# Patient Record
Sex: Female | Born: 1985 | Race: White | Hispanic: No | Marital: Single | Smoking: Former smoker
Health system: Southern US, Community
[De-identification: ages and names within clinical notes are randomized; demographics above are authoritative.]

## PROBLEM LIST (undated history)

## (undated) DIAGNOSIS — F319 Bipolar disorder, unspecified: Secondary | ICD-10-CM

## (undated) DIAGNOSIS — I10 Essential (primary) hypertension: Secondary | ICD-10-CM

## (undated) DIAGNOSIS — F32A Depression, unspecified: Secondary | ICD-10-CM

## (undated) DIAGNOSIS — F329 Major depressive disorder, single episode, unspecified: Secondary | ICD-10-CM

## (undated) DIAGNOSIS — K589 Irritable bowel syndrome without diarrhea: Secondary | ICD-10-CM

## (undated) DIAGNOSIS — G43909 Migraine, unspecified, not intractable, without status migrainosus: Secondary | ICD-10-CM

---
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
## 2005-01-08 ENCOUNTER — Ambulatory Visit: Payer: Self-pay | Admitting: Family Medicine

## 2005-01-11 ENCOUNTER — Ambulatory Visit: Payer: Self-pay | Admitting: Family Medicine

## 2005-04-04 ENCOUNTER — Emergency Department (HOSPITAL_COMMUNITY): Admission: AD | Admit: 2005-04-04 | Discharge: 2005-04-04 | Payer: Self-pay | Admitting: Emergency Medicine

## 2005-10-04 ENCOUNTER — Emergency Department (HOSPITAL_COMMUNITY): Admission: EM | Admit: 2005-10-04 | Discharge: 2005-10-04 | Payer: Self-pay | Admitting: Emergency Medicine

## 2005-11-26 ENCOUNTER — Emergency Department (HOSPITAL_COMMUNITY): Admission: EM | Admit: 2005-11-26 | Discharge: 2005-11-26 | Payer: Self-pay | Admitting: Family Medicine

## 2006-03-31 ENCOUNTER — Emergency Department (HOSPITAL_COMMUNITY): Admission: EM | Admit: 2006-03-31 | Discharge: 2006-03-31 | Payer: Self-pay

## 2006-04-10 ENCOUNTER — Emergency Department (HOSPITAL_COMMUNITY): Admission: EM | Admit: 2006-04-10 | Discharge: 2006-04-10 | Payer: Self-pay | Admitting: Emergency Medicine

## 2006-07-21 ENCOUNTER — Emergency Department (HOSPITAL_COMMUNITY): Admission: EM | Admit: 2006-07-21 | Discharge: 2006-07-21 | Payer: Self-pay | Admitting: Family Medicine

## 2006-08-22 ENCOUNTER — Emergency Department (HOSPITAL_COMMUNITY): Admission: EM | Admit: 2006-08-22 | Discharge: 2006-08-22 | Payer: Self-pay | Admitting: Family Medicine

## 2006-09-09 ENCOUNTER — Emergency Department (HOSPITAL_COMMUNITY): Admission: EM | Admit: 2006-09-09 | Discharge: 2006-09-10 | Payer: Self-pay | Admitting: Emergency Medicine

## 2006-09-12 ENCOUNTER — Emergency Department (HOSPITAL_COMMUNITY): Admission: EM | Admit: 2006-09-12 | Discharge: 2006-09-12 | Payer: Self-pay | Admitting: Emergency Medicine

## 2006-09-18 ENCOUNTER — Emergency Department (HOSPITAL_COMMUNITY): Admission: EM | Admit: 2006-09-18 | Discharge: 2006-09-18 | Payer: Self-pay | Admitting: Family Medicine

## 2006-09-20 ENCOUNTER — Emergency Department (HOSPITAL_COMMUNITY): Admission: EM | Admit: 2006-09-20 | Discharge: 2006-09-20 | Payer: Self-pay | Admitting: Emergency Medicine

## 2006-11-06 ENCOUNTER — Emergency Department (HOSPITAL_COMMUNITY): Admission: EM | Admit: 2006-11-06 | Discharge: 2006-11-06 | Payer: Self-pay | Admitting: Emergency Medicine

## 2006-11-12 ENCOUNTER — Emergency Department (HOSPITAL_COMMUNITY): Admission: EM | Admit: 2006-11-12 | Discharge: 2006-11-12 | Payer: Self-pay | Admitting: Emergency Medicine

## 2006-11-13 ENCOUNTER — Emergency Department (HOSPITAL_COMMUNITY): Admission: EM | Admit: 2006-11-13 | Discharge: 2006-11-13 | Payer: Self-pay | Admitting: Emergency Medicine

## 2006-11-14 ENCOUNTER — Ambulatory Visit: Payer: Self-pay | Admitting: Hospitalist

## 2006-11-14 ENCOUNTER — Inpatient Hospital Stay (HOSPITAL_COMMUNITY): Admission: EM | Admit: 2006-11-14 | Discharge: 2006-11-15 | Payer: Self-pay | Admitting: Emergency Medicine

## 2006-12-22 ENCOUNTER — Emergency Department (HOSPITAL_COMMUNITY): Admission: EM | Admit: 2006-12-22 | Discharge: 2006-12-23 | Payer: Self-pay | Admitting: Emergency Medicine

## 2007-04-02 ENCOUNTER — Emergency Department (HOSPITAL_COMMUNITY): Admission: EM | Admit: 2007-04-02 | Discharge: 2007-04-03 | Payer: Self-pay | Admitting: Emergency Medicine

## 2007-04-06 ENCOUNTER — Emergency Department (HOSPITAL_COMMUNITY): Admission: EM | Admit: 2007-04-06 | Discharge: 2007-04-06 | Payer: Self-pay | Admitting: Emergency Medicine

## 2007-04-16 ENCOUNTER — Emergency Department (HOSPITAL_COMMUNITY): Admission: EM | Admit: 2007-04-16 | Discharge: 2007-04-16 | Payer: Self-pay | Admitting: Emergency Medicine

## 2007-06-05 ENCOUNTER — Emergency Department (HOSPITAL_COMMUNITY): Admission: EM | Admit: 2007-06-05 | Discharge: 2007-06-05 | Payer: Self-pay | Admitting: Family Medicine

## 2007-06-18 ENCOUNTER — Emergency Department (HOSPITAL_COMMUNITY): Admission: EM | Admit: 2007-06-18 | Discharge: 2007-06-18 | Payer: Self-pay | Admitting: Family Medicine

## 2007-06-19 ENCOUNTER — Inpatient Hospital Stay (HOSPITAL_COMMUNITY): Admission: AD | Admit: 2007-06-19 | Discharge: 2007-06-19 | Payer: Self-pay | Admitting: Obstetrics and Gynecology

## 2007-06-20 ENCOUNTER — Inpatient Hospital Stay (HOSPITAL_COMMUNITY): Admission: AD | Admit: 2007-06-20 | Discharge: 2007-06-20 | Payer: Self-pay | Admitting: Obstetrics and Gynecology

## 2007-08-20 ENCOUNTER — Inpatient Hospital Stay (HOSPITAL_COMMUNITY): Admission: AD | Admit: 2007-08-20 | Discharge: 2007-08-20 | Payer: Self-pay | Admitting: Family Medicine

## 2007-08-23 ENCOUNTER — Emergency Department (HOSPITAL_COMMUNITY): Admission: EM | Admit: 2007-08-23 | Discharge: 2007-08-23 | Payer: Self-pay | Admitting: Emergency Medicine

## 2007-10-05 ENCOUNTER — Emergency Department (HOSPITAL_COMMUNITY): Admission: EM | Admit: 2007-10-05 | Discharge: 2007-10-05 | Payer: Self-pay | Admitting: Emergency Medicine

## 2008-01-15 ENCOUNTER — Emergency Department (HOSPITAL_COMMUNITY): Admission: EM | Admit: 2008-01-15 | Discharge: 2008-01-15 | Payer: Self-pay | Admitting: Family Medicine

## 2008-02-20 ENCOUNTER — Emergency Department (HOSPITAL_COMMUNITY): Admission: EM | Admit: 2008-02-20 | Discharge: 2008-02-20 | Payer: Self-pay | Admitting: Family Medicine

## 2008-05-07 ENCOUNTER — Emergency Department (HOSPITAL_COMMUNITY): Admission: EM | Admit: 2008-05-07 | Discharge: 2008-05-07 | Payer: Self-pay | Admitting: Family Medicine

## 2008-07-08 ENCOUNTER — Emergency Department (HOSPITAL_COMMUNITY): Admission: EM | Admit: 2008-07-08 | Discharge: 2008-07-08 | Payer: Self-pay | Admitting: Emergency Medicine

## 2008-08-18 ENCOUNTER — Ambulatory Visit: Payer: Self-pay | Admitting: Diagnostic Radiology

## 2008-08-18 ENCOUNTER — Emergency Department (HOSPITAL_BASED_OUTPATIENT_CLINIC_OR_DEPARTMENT_OTHER): Admission: EM | Admit: 2008-08-18 | Discharge: 2008-08-18 | Payer: Self-pay | Admitting: Emergency Medicine

## 2008-08-29 ENCOUNTER — Emergency Department (HOSPITAL_COMMUNITY): Admission: EM | Admit: 2008-08-29 | Discharge: 2008-08-29 | Payer: Self-pay | Admitting: Emergency Medicine

## 2009-04-30 ENCOUNTER — Emergency Department (HOSPITAL_COMMUNITY): Admission: EM | Admit: 2009-04-30 | Discharge: 2009-04-30 | Payer: Self-pay | Admitting: Emergency Medicine

## 2009-09-08 ENCOUNTER — Emergency Department (HOSPITAL_COMMUNITY): Admission: EM | Admit: 2009-09-08 | Discharge: 2009-09-08 | Payer: Self-pay | Admitting: Emergency Medicine

## 2009-12-27 ENCOUNTER — Emergency Department (HOSPITAL_COMMUNITY): Admission: EM | Admit: 2009-12-27 | Discharge: 2009-12-28 | Payer: Self-pay | Admitting: Emergency Medicine

## 2010-04-22 ENCOUNTER — Emergency Department (HOSPITAL_COMMUNITY)
Admission: EM | Admit: 2010-04-22 | Discharge: 2010-04-22 | Payer: Self-pay | Source: Home / Self Care | Admitting: Emergency Medicine

## 2010-04-22 LAB — URINALYSIS, ROUTINE W REFLEX MICROSCOPIC
Ketones, ur: NEGATIVE mg/dL
Nitrite: NEGATIVE
Protein, ur: NEGATIVE mg/dL
Specific Gravity, Urine: 1.005 (ref 1.005–1.030)
Urine Glucose, Fasting: NEGATIVE mg/dL
Urobilinogen, UA: 0.2 mg/dL (ref 0.0–1.0)
pH: 6 (ref 5.0–8.0)

## 2010-04-22 LAB — URINE MICROSCOPIC-ADD ON

## 2010-04-22 LAB — WET PREP, GENITAL: Trich, Wet Prep: NONE SEEN

## 2010-04-22 LAB — POCT PREGNANCY, URINE: Preg Test, Ur: NEGATIVE

## 2010-04-23 LAB — GC/CHLAMYDIA PROBE AMP, GENITAL
Chlamydia, DNA Probe: NEGATIVE
GC Probe Amp, Genital: NEGATIVE

## 2010-06-15 LAB — URINALYSIS, ROUTINE W REFLEX MICROSCOPIC
Bilirubin Urine: NEGATIVE
Glucose, UA: NEGATIVE mg/dL
Hgb urine dipstick: NEGATIVE
Ketones, ur: NEGATIVE mg/dL
Nitrite: NEGATIVE
Protein, ur: NEGATIVE mg/dL
Specific Gravity, Urine: 1.015 (ref 1.005–1.030)
Urobilinogen, UA: 0.2 mg/dL (ref 0.0–1.0)
pH: 6 (ref 5.0–8.0)

## 2010-06-15 LAB — PREGNANCY, URINE: Preg Test, Ur: NEGATIVE

## 2010-06-15 LAB — URINE CULTURE: Colony Count: 35000

## 2010-06-15 LAB — URINE MICROSCOPIC-ADD ON

## 2010-06-17 LAB — POCT RAPID STREP A (OFFICE): Streptococcus, Group A Screen (Direct): NEGATIVE

## 2010-07-08 LAB — RAPID STREP SCREEN (MED CTR MEBANE ONLY): Streptococcus, Group A Screen (Direct): POSITIVE — AB
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
## 2010-10-18 ENCOUNTER — Observation Stay (HOSPITAL_COMMUNITY)
Admission: EM | Admit: 2010-10-18 | Discharge: 2010-10-19 | DRG: 603 | Disposition: A | Discharge status: Home Health | Payer: Medicaid Other | Attending: Internal Medicine | Admitting: Internal Medicine

## 2010-10-18 DIAGNOSIS — L02219 Cutaneous abscess of trunk, unspecified: Principal | ICD-10-CM | POA: Diagnosis present

## 2010-10-18 DIAGNOSIS — F3289 Other specified depressive episodes: Secondary | ICD-10-CM | POA: Diagnosis present

## 2010-10-18 DIAGNOSIS — K589 Irritable bowel syndrome without diarrhea: Secondary | ICD-10-CM | POA: Insufficient documentation

## 2010-10-18 DIAGNOSIS — F329 Major depressive disorder, single episode, unspecified: Secondary | ICD-10-CM | POA: Diagnosis present

## 2010-10-18 DIAGNOSIS — E876 Hypokalemia: Secondary | ICD-10-CM | POA: Diagnosis present

## 2010-10-18 DIAGNOSIS — I10 Essential (primary) hypertension: Secondary | ICD-10-CM | POA: Diagnosis present

## 2010-10-18 DIAGNOSIS — L03319 Cellulitis of trunk, unspecified: Secondary | ICD-10-CM | POA: Diagnosis present

## 2010-10-18 DIAGNOSIS — D72829 Elevated white blood cell count, unspecified: Secondary | ICD-10-CM | POA: Diagnosis present

## 2010-10-18 LAB — URINALYSIS, ROUTINE W REFLEX MICROSCOPIC
Bilirubin Urine: NEGATIVE
Leukocytes, UA: NEGATIVE
Nitrite: NEGATIVE
Urobilinogen, UA: 1 mg/dL (ref 0.0–1.0)

## 2010-10-18 LAB — CBC
HCT: 40.4 % (ref 36.0–46.0)
MCH: 30.2 pg (ref 26.0–34.0)
MCHC: 34.7 g/dL (ref 30.0–36.0)
MCV: 87.1 fL (ref 78.0–100.0)
Platelets: 238 10*3/uL (ref 150–400)
RBC: 4.64 MIL/uL (ref 3.87–5.11)
RDW: 12.1 % (ref 11.5–15.5)
WBC: 16.4 10*3/uL — ABNORMAL HIGH (ref 4.0–10.5)

## 2010-10-18 LAB — BASIC METABOLIC PANEL
CO2: 28 mEq/L (ref 19–32)
Calcium: 10 mg/dL (ref 8.4–10.5)
Chloride: 96 mEq/L (ref 96–112)
Creatinine, Ser: 0.66 mg/dL (ref 0.50–1.10)
GFR calc Af Amer: >60 mL/min (ref >60)
Sodium: 134 mEq/L — ABNORMAL LOW (ref 135–145)

## 2010-10-18 LAB — DIFFERENTIAL
Basophils Absolute: 0 10*3/uL (ref 0.0–0.1)
Basophils Relative: 0 % (ref 0–1)
Eosinophils Absolute: 0.2 10*3/uL (ref 0.0–0.7)
Eosinophils Relative: 1 % (ref 0–5)
Lymphocytes Relative: 10 % — ABNORMAL LOW (ref 12–46)
Lymphs Abs: 1.6 10*3/uL (ref 0.7–4.0)
Monocytes Absolute: 1.2 10*3/uL — ABNORMAL HIGH (ref 0.1–1.0)
Monocytes Relative: 7 % (ref 3–12)
Neutro Abs: 13.5 10*3/uL — ABNORMAL HIGH (ref 1.7–7.7)
Neutrophils Relative %: 82 % — ABNORMAL HIGH (ref 43–77)

## 2010-10-18 LAB — PREGNANCY, URINE: Preg Test, Ur: NEGATIVE

## 2010-10-19 LAB — BASIC METABOLIC PANEL
BUN: 7 mg/dL (ref 6–23)
Calcium: 9.1 mg/dL (ref 8.4–10.5)
Creatinine, Ser: 0.66 mg/dL (ref 0.50–1.10)
GFR calc non Af Amer: >60 mL/min (ref >60)
Glucose, Bld: 108 mg/dL — ABNORMAL HIGH (ref 70–99)

## 2010-10-19 LAB — CBC
HCT: 35.5 % — ABNORMAL LOW (ref 36.0–46.0)
Hemoglobin: 12 g/dL (ref 12.0–15.0)
MCH: 30 pg (ref 26.0–34.0)
MCHC: 33.8 g/dL (ref 30.0–36.0)
MCV: 88.8 fL (ref 78.0–100.0)
RDW: 12.1 % (ref 11.5–15.5)

## 2010-10-21 LAB — CULTURE, ROUTINE-ABSCESS

## 2010-11-01 NOTE — H&P (Signed)
[name] Whitney, [name] NO.:  [id number]  MEDICAL RECORD NO.:  [id number]  LOCATION:  WLED                         FACILITY:  [facility]  PHYSICIAN:  Tana Felts, MD     DATE OF BIRTH:  07-30-1985  DATE OF ADMISSION:  10/18/2010 DATE OF DISCHARGE:                             HISTORY & PHYSICAL   CHIEF COMPLAINT:  Groin abscess.  HISTORY OF PRESENT ILLNESS:  This is a 25 year old woman with hypertension, depression, IBS, and migraines, who presents with swelling, pain, and erythema in the right groin up to the last 3 days. She states that it is greater than the size of a tennis ball and was accompanied by nausea, vomiting, chills, and sweating.  There are no fevers that she was aware of.  She does shave that area.  She denies any previous abscess or concurrent abscesses.  The pain has become quite severe.  So, she came to the emergency room. In the emergency room, it was incised and drained.  She was started on ceftriaxone and vancomycin.  REVIEW OF SYSTEMS:  Ten-system review is otherwise negative.  PAST MEDICAL HISTORY: 1. Hypertension. 2. Depression. 3. Irritable bowel syndrome. 4. Migraines.  PAST SURGICAL HISTORY:  None.  SOCIAL HISTORY:  She denies smoking, alcohol, or drug use.  She is not currently working.  She lives with her mother in [place] and has no recent travel.  FAMILY HISTORY:  Diabetes, hypertension, and cancer.  ALLERGIES:  PHENERGAN.  HOME MEDICATIONS:  None.  PHYSICAL EXAMINATION:  VITAL SIGNS:  Temperature 100, pulse 96, blood pressure 123/83, respiratory rate 16, sating 98% on room air. GENERAL:  She is well-nourished, well-developed young woman in no acute distress, though she does appear quite uncomfortable. HEENT:  Pupils are somewhat dilated, equal, mildly reactive.  There is no scleral icterus or conjunctivitis.  She has mucous membranes are moist.  Tongue piercing.  Oropharynx is clear. NECK:  Supple.  No  lymphadenopathy. LUNGS:  Clear to auscultation bilaterally on anterior exam. CARDIAC:  Regular rate and rhythm.  No murmurs, rubs, or gallops. ABDOMEN:  Soft, nontender, nondistended.  Normal bowel sounds. EXTREMITIES:  Warm, well perfused.  No cyanosis, clubbing, or edema. GENITOURINARY:  Shaved external genitalia.  Normal appearing with exception of significant swelling and erythema, just superior to the right vulvar region with erythema extending into the groin and upper thigh.  It is tender to palpation and covered by a bandage at this time.  LABORATORY DATA:  UA was unremarkable.  White count 16.4, hemoglobin 14, platelets 238,000.  Chemistries showed creatinine 0.66, potassium 3.3, otherwise unremarkable.  ASSESSMENT:  This is a 25 year old woman with 3-day history of abscess and some systemic symptoms.  It has been incised and drained and she was started on intravenous antibiotics and admitted overnight for observation.  PLAN:  Now the abscess has been I and D'd, it should improve.  We will treat her with IV antibiotics.  She can probably go home on something like Augmentin to cover for strep and some gram negative organisms.  She will use Tylenol and opiates as need for pain control.     Tana Felts, MD  NB/MEDQ  D:  10/18/2010  T:  10/18/2010  Job:  [id number]  Electronically Signed by Tana Felts M.D. on 11/01/2010 01:53:19 PM

## 2010-11-07 NOTE — Discharge Summary (Signed)
  [name], [name]                ACCOUNT NO.:  [id number]  MEDICAL RECORD NO.:  [id number]  LOCATION:  1339                         [facility]ACILITY:  [facility]  PHYSICIAN:  Peggye Pitt, M.D. DATE OF BIRTH:  1985/12/27  DATE OF ADMISSION:  10/18/2010 DATE OF DISCHARGE:  10/19/2010                              DISCHARGE SUMMARY   NEW PRIMARY CARE PHYSICIAN:  Lonia Blood, M.D.  DISCHARGE DIAGNOSES: 1. Right mons pubis abscess, status post I & D with culture data     pending. 2. Hypokalemia, to be repleted. 3. Leukocytosis secondary to right mons pubis abscess. 4. Hypertension, not on medications. 5. Irritable bowel syndrome. 6. Migraines.  DISCHARGE MEDICATIONS:  Include: 1. Doxycycline 100 mg twice daily for 14 days. 2. Oxycodone 5 mg every 4 hours as needed for pain.  DISPOSITION AND FOLLOWUP:  Ms. Wurtz will be discharged home today in stable and improved condition.  We will have her take antibiotics for 2 weeks.  Followup with her newly appointed PCP and this appointment has been scheduled prior to her discharge.  We will also give her pain medication as well as will set up a home health RN to help her with her dressing changes.  CONSULTATIONS FOR THIS HOSPITALIZATION:  None.  IMAGES AND PROCEDURES:  None.  HISTORY AND PHYSICAL:  For full details, please refer to dictation on October 18, 2010 by Dr. Orson Slick but in brief, [name] Whitney is a 25 year old young lady with a history of IBS and migraines, who presented with a swelling, pain, and erythema in the right groin for the last 3 days.  She denies a fevers, chills, or diaphoresis.  She was seen in the emergency department where she had an incision and drainage, was started on IV antibiotics.  We are asked to admitted her for further evaluation and management.  HOSPITAL COURSE BY PROBLEMS: 1. Right mons pubis abscess.  At this point, I will transition over to     oral antibiotics to complete a 2-week course.  She is afebrile.  She does have a white count of 16,000.  We will also have home     health RN to dress her wounds.  She will be ready for discharge     home today. 2. Hypokalemia, potassium on day of discharge is 3.0.  We will give     her 40 mEq of potassium prior to discharge.  CONDITIONS:  Stable.  Vitals on day of discharge; blood pressure 117/77, heart rate 74, respirations 20, sats of 99% on room air, and temperature of 98.1.     Peggye Pitt, M.D.     EH/MEDQ  D:  10/19/2010  T:  10/19/2010  Job:  [id number]  cc:   Lonia Blood, M.D.  Electronically Signed by Peggye Pitt M.D. on 11/07/2010 05:19:43 PM

## 2010-12-21 LAB — POCT URINALYSIS DIP (DEVICE)
Bilirubin Urine: NEGATIVE
Glucose, UA: NEGATIVE
Nitrite: NEGATIVE
Operator id: 235561
Specific Gravity, Urine: 1.02
Urobilinogen, UA: 0.2

## 2010-12-21 LAB — CBC
HCT: 37.3
Hemoglobin: 12.7
MCV: 89.7
Platelets: 247
Platelets: 263
RDW: 12.8
RDW: 12.9

## 2010-12-21 LAB — POCT PREGNANCY, URINE
Operator id: 235561
Operator id: 120561
Preg Test, Ur: NEGATIVE
Preg Test, Ur: NEGATIVE

## 2010-12-21 LAB — RAPID URINE DRUG SCREEN, HOSP PERFORMED
Barbiturates: NOT DETECTED
Opiates: NOT DETECTED
Tetrahydrocannabinol: POSITIVE — AB

## 2010-12-21 LAB — GC/CHLAMYDIA PROBE AMP, GENITAL: Chlamydia, DNA Probe: NEGATIVE

## 2010-12-21 LAB — WET PREP, GENITAL: Yeast Wet Prep HPF POC: NONE SEEN

## 2010-12-23 LAB — POCT I-STAT, CHEM 8
Calcium, Ion: 1.19
Creatinine, Ser: 0.9
Glucose, Bld: 95
HCT: 40
Hemoglobin: 13.6
TCO2: 24

## 2010-12-23 LAB — URINALYSIS, ROUTINE W REFLEX MICROSCOPIC
Bilirubin Urine: NEGATIVE
Hgb urine dipstick: NEGATIVE
Ketones, ur: NEGATIVE
Nitrite: NEGATIVE
Protein, ur: NEGATIVE
Urobilinogen, UA: 0.2
Urobilinogen, UA: 0.2

## 2010-12-23 LAB — DIFFERENTIAL
Basophils Relative: 0
Eosinophils Absolute: 0.4
Eosinophils Relative: 3
Monocytes Relative: 7
Neutrophils Relative %: 62

## 2010-12-23 LAB — CBC
HCT: 37.7
MCHC: 34.5
MCV: 90.3
RBC: 4.18

## 2010-12-23 LAB — GC/CHLAMYDIA PROBE AMP, GENITAL: GC Probe Amp, Genital: NEGATIVE

## 2010-12-23 LAB — WET PREP, GENITAL: Trich, Wet Prep: NONE SEEN

## 2010-12-28 LAB — DIFFERENTIAL
Basophils Absolute: 0
Basophils Relative: 0
Eosinophils Absolute: 0
Monocytes Absolute: 0.7
Monocytes Relative: 11
Neutro Abs: 4.7
Neutrophils Relative %: 76

## 2010-12-28 LAB — CBC
MCHC: 33.3
MCV: 90.9
RBC: 4.62
RDW: 13.1

## 2010-12-28 LAB — POCT URINALYSIS DIP (DEVICE)
Glucose, UA: NEGATIVE
Hgb urine dipstick: NEGATIVE
Nitrite: NEGATIVE
Specific Gravity, Urine: 1.01
Urobilinogen, UA: 0.2
pH: 7

## 2011-01-07 LAB — PREGNANCY, URINE: Preg Test, Ur: NEGATIVE

## 2011-01-07 LAB — URINALYSIS, ROUTINE W REFLEX MICROSCOPIC
Bilirubin Urine: NEGATIVE
Glucose, UA: NEGATIVE
Ketones, ur: NEGATIVE
Protein, ur: NEGATIVE
pH: 6

## 2011-01-07 LAB — URINE MICROSCOPIC-ADD ON

## 2011-01-08 LAB — URINALYSIS, ROUTINE W REFLEX MICROSCOPIC
Bilirubin Urine: NEGATIVE
Glucose, UA: NEGATIVE
Hgb urine dipstick: NEGATIVE
Ketones, ur: NEGATIVE
Nitrite: NEGATIVE
Protein, ur: NEGATIVE
Specific Gravity, Urine: 1.026
Urobilinogen, UA: 1
pH: 7

## 2011-01-08 LAB — DIFFERENTIAL
Basophils Absolute: 0.1
Basophils Relative: 0
Eosinophils Absolute: 0.1
Eosinophils Absolute: 0.2
Eosinophils Relative: 1
Eosinophils Relative: 2
Lymphocytes Relative: 23
Lymphocytes Relative: 7 — ABNORMAL LOW
Lymphs Abs: 0.8
Lymphs Abs: 3.5 — ABNORMAL HIGH
Monocytes Absolute: 0.3
Monocytes Absolute: 0.9 — ABNORMAL HIGH
Monocytes Relative: 6
Neutro Abs: 10.4 — ABNORMAL HIGH
Neutrophils Relative %: 69

## 2011-01-08 LAB — GIARDIA/CRYPTOSPORIDIUM SCREEN(EIA)
Cryptosporidium Screen (EIA): NEGATIVE
Giardia Screen - EIA: NEGATIVE

## 2011-01-08 LAB — CBC
HCT: 39.3
Hemoglobin: 13.5
MCHC: 34.3
MCV: 89.5
MCV: 90.3
Platelets: 188
Platelets: 293
RBC: 4.35
RDW: 13.3
WBC: 10.2
WBC: 15.1 — ABNORMAL HIGH

## 2011-01-08 LAB — CLOSTRIDIUM DIFFICILE EIA: C difficile Toxins A+B, EIA: NEGATIVE

## 2011-01-08 LAB — COMPREHENSIVE METABOLIC PANEL WITH GFR
AST: 16
Albumin: 3.6
Alkaline Phosphatase: 45
Chloride: 106
Creatinine, Ser: 0.98
GFR calc Af Amer: >60
Potassium: 3.4 — ABNORMAL LOW
Total Bilirubin: 0.4
Total Protein: 7.1

## 2011-01-08 LAB — I-STAT 8, (EC8 V) (CONVERTED LAB)
Acid-base deficit: 9 — ABNORMAL HIGH
BUN: 12
Bicarbonate: 15.3 — ABNORMAL LOW
Chloride: 110
Glucose, Bld: 95
HCT: 44
Hemoglobin: 15
Operator id: 192351
Potassium: 3 — ABNORMAL LOW
Sodium: 139
TCO2: 16
pCO2, Ven: 28 — ABNORMAL LOW
pH, Ven: 7.346 — ABNORMAL HIGH

## 2011-01-08 LAB — ETHANOL
Alcohol, Ethyl (B): 194 — ABNORMAL HIGH
Alcohol, Ethyl (B): <5
Alcohol, Ethyl (B): <5

## 2011-01-08 LAB — POCT CARDIAC MARKERS
CKMB, poc: <1 — ABNORMAL LOW
Myoglobin, poc: 40.3
Operator id: 294511
Troponin i, poc: <0.05

## 2011-01-08 LAB — COMPREHENSIVE METABOLIC PANEL
ALT: 16
Albumin: 2.9 — ABNORMAL LOW
Alkaline Phosphatase: 42
BUN: 12
BUN: 16
CO2: 24
Calcium: 8.7
Chloride: 112
GFR calc non Af Amer: >60
Glucose, Bld: 117 — ABNORMAL HIGH
Glucose, Bld: 87
Potassium: 3.7
Sodium: 136
Total Bilirubin: 0.8

## 2011-01-08 LAB — POCT PREGNANCY, URINE
Operator id: 29451
Preg Test, Ur: NEGATIVE

## 2011-01-08 LAB — OCCULT BLOOD X 1 CARD TO LAB, STOOL: Fecal Occult Bld: POSITIVE

## 2011-01-08 LAB — GC/CHLAMYDIA PROBE AMP, GENITAL
Chlamydia, DNA Probe: NEGATIVE
GC Probe Amp, Genital: NEGATIVE

## 2011-01-08 LAB — URINE CULTURE: Colony Count: 8000

## 2011-01-08 LAB — LIPASE, BLOOD
Lipase: 11
Lipase: 12

## 2011-01-08 LAB — CULTURE, BLOOD (ROUTINE X 2): Culture: NO GROWTH

## 2011-01-08 LAB — POCT I-STAT CREATININE
Creatinine, Ser: 1.1
Operator id: 192351

## 2011-01-08 LAB — WET PREP, GENITAL
Clue Cells Wet Prep HPF POC: NONE SEEN
Trich, Wet Prep: NONE SEEN
Yeast Wet Prep HPF POC: NONE SEEN

## 2011-01-08 LAB — STOOL CULTURE

## 2011-01-08 LAB — URINE MICROSCOPIC-ADD ON

## 2011-04-20 ENCOUNTER — Emergency Department (HOSPITAL_COMMUNITY): Payer: Medicaid Other

## 2011-04-20 ENCOUNTER — Emergency Department (HOSPITAL_COMMUNITY)
Admission: EM | Admit: 2011-04-20 | Discharge: 2011-04-21 | Disposition: A | Discharge status: Other Acute Inpatient Hospital | Payer: Medicaid Other | Attending: Internal Medicine | Admitting: Internal Medicine

## 2011-04-20 ENCOUNTER — Other Ambulatory Visit: Payer: Self-pay

## 2011-04-20 ENCOUNTER — Encounter (HOSPITAL_COMMUNITY): Payer: Self-pay | Admitting: Emergency Medicine

## 2011-04-20 DIAGNOSIS — F329 Major depressive disorder, single episode, unspecified: Secondary | ICD-10-CM | POA: Insufficient documentation

## 2011-04-20 DIAGNOSIS — Z79899 Other long term (current) drug therapy: Secondary | ICD-10-CM | POA: Insufficient documentation

## 2011-04-20 DIAGNOSIS — G43909 Migraine, unspecified, not intractable, without status migrainosus: Secondary | ICD-10-CM | POA: Insufficient documentation

## 2011-04-20 DIAGNOSIS — F29 Unspecified psychosis not due to a substance or known physiological condition: Secondary | ICD-10-CM

## 2011-04-20 DIAGNOSIS — I10 Essential (primary) hypertension: Secondary | ICD-10-CM | POA: Insufficient documentation

## 2011-04-20 DIAGNOSIS — F3289 Other specified depressive episodes: Secondary | ICD-10-CM | POA: Insufficient documentation

## 2011-04-20 DIAGNOSIS — F17200 Nicotine dependence, unspecified, uncomplicated: Secondary | ICD-10-CM | POA: Insufficient documentation

## 2011-04-20 DIAGNOSIS — K589 Irritable bowel syndrome without diarrhea: Secondary | ICD-10-CM | POA: Insufficient documentation

## 2011-04-20 HISTORY — DX: Depression, unspecified: F32.A

## 2011-04-20 HISTORY — DX: Essential (primary) hypertension: I10

## 2011-04-20 HISTORY — DX: Major depressive disorder, single episode, unspecified: F32.9

## 2011-04-20 HISTORY — DX: Irritable bowel syndrome, unspecified: K58.9

## 2011-04-20 HISTORY — DX: Migraine, unspecified, not intractable, without status migrainosus: G43.909

## 2011-04-20 LAB — URINALYSIS, ROUTINE W REFLEX MICROSCOPIC
Glucose, UA: NEGATIVE mg/dL
Specific Gravity, Urine: 1.02 (ref 1.005–1.030)

## 2011-04-20 LAB — COMPREHENSIVE METABOLIC PANEL
BUN: 12 mg/dL (ref 6–23)
CO2: 23 mEq/L (ref 19–32)
Chloride: 102 mEq/L (ref 96–112)
Creatinine, Ser: 0.97 mg/dL (ref 0.50–1.10)
GFR calc non Af Amer: 81 mL/min — ABNORMAL LOW (ref >90)
Total Bilirubin: 0.5 mg/dL (ref 0.3–1.2)

## 2011-04-20 LAB — DIFFERENTIAL
Basophils Absolute: 0 10*3/uL (ref 0.0–0.1)
Eosinophils Relative: 3 % (ref 0–5)
Lymphocytes Relative: 16 % (ref 12–46)
Monocytes Absolute: 0.7 10*3/uL (ref 0.1–1.0)

## 2011-04-20 LAB — SALICYLATE LEVEL: Salicylate Lvl: 0.6 mg/dL — ABNORMAL LOW (ref 2.8–20.0)

## 2011-04-20 LAB — BLOOD GAS, ARTERIAL
Bicarbonate: 22.7 mEq/L (ref 20.0–24.0)
FIO2: 0.21 %
O2 Saturation: 97.2 %
Patient temperature: 37
pH, Arterial: 7.424 — ABNORMAL HIGH (ref 7.350–7.400)

## 2011-04-20 LAB — CBC
HCT: 40.6 % (ref 36.0–46.0)
MCH: 31 pg (ref 26.0–34.0)
MCV: 93.1 fL (ref 78.0–100.0)
RDW: 13 % (ref 11.5–15.5)
WBC: 13.3 10*3/uL — ABNORMAL HIGH (ref 4.0–10.5)

## 2011-04-20 LAB — AMMONIA: Ammonia: 16 umol/L (ref 11–60)

## 2011-04-20 LAB — RAPID URINE DRUG SCREEN, HOSP PERFORMED: Opiates: NOT DETECTED

## 2011-04-20 LAB — URINE MICROSCOPIC-ADD ON

## 2011-04-20 LAB — ETHANOL: Alcohol, Ethyl (B): <11 mg/dL (ref 0–11)

## 2011-04-20 LAB — PREGNANCY, URINE: Preg Test, Ur: NEGATIVE

## 2011-04-20 MED ORDER — IOHEXOL 350 MG/ML SOLN
100.0000 mL | Freq: Once | INTRAVENOUS | Status: AC | PRN
  Administered 2011-04-20: 100 mL via INTRAVENOUS

## 2011-04-20 MED ORDER — SODIUM CHLORIDE 0.9 % IV SOLN
INTRAVENOUS | Status: DC
  Administered 2011-04-20: 17:00:00 via INTRAVENOUS

## 2011-04-20 NOTE — ED Notes (Signed)
Pt brought in by family for confusion. Pt states she knows she is at the hospital but does not know why. When questions were asked about how she felt she replied with vanilla.

## 2011-04-20 NOTE — ED Provider Notes (Signed)
History     CSN: [id number]  Arrival date & time 04/20/11  1555   Chief Complaint  Patient presents with  . Altered Mental Status    Patient is a 26 y.o. female presenting with altered mental status. The history is provided by the patient and a relative. History Limited By: AMS.  Altered Mental Status  Pt was seen at 1620.  Per pt's grandfather, states he was driving in the car to run some errands when the pt "just slumped over" so he drove her to the hospital.  Pt cannot say why she is here, what happened PTA.  Has rambling speech regarding her grandmother with HTN and DM and "I have it too."  Endorses also "being under a lot of stress this week."  Denies any specific symptoms of CP, SOB, abd pain, back pain.  No reported recent injuries.      Past Medical History  Diagnosis Date  . Hypertension   . Migraine headache   . Depression   . Irritable bowel syndrome (IBS)     History reviewed. No pertinent past surgical history.   History  Substance Use Topics  . Smoking status: Current Everyday Smoker    Types: Cigarettes  . Smokeless tobacco: Not on file  . Alcohol Use:     Review of Systems  Unable to perform ROS: Mental status change  Psychiatric/Behavioral: Positive for altered mental status.    Allergies  Review of patient's allergies indicates no known allergies.  Home Medications   Current Outpatient Rx  Name Route Sig Dispense Refill  . FLUOXETINE HCL 40 MG PO CAPS Oral Take 80 mg by mouth 2 (two) times daily.    Marland Kitchen LITHIUM CARBONATE ER 300 MG PO TBCR Oral Take 300 mg by mouth 2 (two) times daily.    Marland Kitchen PROPRANOLOL HCL 20 MG PO TABS Oral Take 20 mg by mouth 2 (two) times daily.      BP 135/100  Pulse 90  Resp 18  SpO2 93%  Physical Exam 1625: Physical examination:  Nursing notes reviewed; Vital signs and O2 SAT reviewed;  Constitutional: Well developed, Well nourished, Well hydrated, In no acute distress; Head:  Normocephalic, atraumatic; Eyes: EOMI,  PERRL, No scleral icterus; ENMT: Mouth and pharynx normal, Mucous membranes moist; Neck: Supple, Full range of motion, No lymphadenopathy; Cardiovascular: Regular rate and rhythm, No murmur, rub, or gallop; Respiratory: Breath sounds clear & equal bilaterally, No rales, rhonchi, wheezes, or rub, Normal respiratory effort/excursion; Chest: Nontender, Movement normal; Abdomen: Soft, Nontender, Nondistended, Normal bowel sounds; Genitourinary: No CVA tenderness; Extremities: Pulses normal, No tenderness, No edema, No calf edema or asymmetry.; Neuro: Awake, alert, confused re: events, Major CN grossly intact. Speech clear, no facial droop, gait steady, normal coordination climbing on and off stretcher on her own.; Skin: Color normal, Warm, Dry, no rash.    ED Course  Procedures    MDM  MDM Reviewed: nursing note, vitals and previous chart Interpretation: labs, CT scan, x-ray and ECG    Date: 04/20/2011  Rate: 93  Rhythm: normal sinus rhythm  QRS Axis: normal  Intervals: QT prolonged  ST/T Wave abnormalities: nonspecific ST/T changes  Conduction Disutrbances:none  Narrative Interpretation:   Old EKG Reviewed: none available.  Results for orders placed during the hospital encounter of 04/20/11  CBC      Component Value Range   WBC 13.3 (*) 4.0 - 10.5 (K/uL)   RBC 4.36  3.87 - 5.11 (MIL/uL)   Hemoglobin 13.5  12.0 -  15.0 (g/dL)   HCT 74.2  59.5 - 63.8 (%)   MCV 93.1  78.0 - 100.0 (fL)   MCH 31.0  26.0 - 34.0 (pg)   MCHC 33.3  30.0 - 36.0 (g/dL)   RDW 75.6  43.3 - 29.5 (%)   Platelets 290  150 - 400 (K/uL)  DIFFERENTIAL      Component Value Range   Neutrophils Relative 76  43 - 77 (%)   Neutro Abs 10.1 (*) 1.7 - 7.7 (K/uL)   Lymphocytes Relative 16  12 - 46 (%)   Lymphs Abs 2.1  0.7 - 4.0 (K/uL)   Monocytes Relative 5  3 - 12 (%)   Monocytes Absolute 0.7  0.1 - 1.0 (K/uL)   Eosinophils Relative 3  0 - 5 (%)   Eosinophils Absolute 0.4  0.0 - 0.7 (K/uL)   Basophils Relative 0  0 -  1 (%)   Basophils Absolute 0.0  0.0 - 0.1 (K/uL)  ACETAMINOPHEN LEVEL      Component Value Range   Acetaminophen (Tylenol), Serum <15.0  10 - 30 (ug/mL)  COMPREHENSIVE METABOLIC PANEL      Component Value Range   Sodium 138  135 - 145 (mEq/L)   Potassium 3.3 (*) 3.5 - 5.1 (mEq/L)   Chloride 102  96 - 112 (mEq/L)   CO2 23  19 - 32 (mEq/L)   Glucose, Bld 135 (*) 70 - 99 (mg/dL)   BUN 12  6 - 23 (mg/dL)   Creatinine, Ser 1.88  0.50 - 1.10 (mg/dL)   Calcium 41.6  8.4 - 10.5 (mg/dL)   Total Protein 8.7 (*) 6.0 - 8.3 (g/dL)   Albumin 4.6  3.5 - 5.2 (g/dL)   AST 18  0 - 37 (U/L)   ALT 20  0 - 35 (U/L)   Alkaline Phosphatase 67  39 - 117 (U/L)   Total Bilirubin 0.5  0.3 - 1.2 (mg/dL)   GFR calc non Af Amer 81 (*) >90 (mL/min)   GFR calc Af Amer >90  >90 (mL/min)  ETHANOL      Component Value Range   Alcohol, Ethyl (B) <11  0 - 11 (mg/dL)  URINE RAPID DRUG SCREEN (HOSP PERFORMED)      Component Value Range   Opiates NONE DETECTED  NONE DETECTED    Cocaine NONE DETECTED  NONE DETECTED    Benzodiazepines NONE DETECTED  NONE DETECTED    Amphetamines NONE DETECTED  NONE DETECTED    Tetrahydrocannabinol NONE DETECTED  NONE DETECTED    Barbiturates NONE DETECTED  NONE DETECTED   URINALYSIS, ROUTINE W REFLEX MICROSCOPIC      Component Value Range   Color, Urine YELLOW  YELLOW    APPearance CLEAR  CLEAR    Specific Gravity, Urine 1.020  1.005 - 1.030    pH 5.0  5.0 - 8.0    Glucose, UA NEGATIVE  NEGATIVE (mg/dL)   Hgb urine dipstick LARGE (*) NEGATIVE    Bilirubin Urine NEGATIVE  NEGATIVE    Ketones, ur NEGATIVE  NEGATIVE (mg/dL)   Protein, ur NEGATIVE  NEGATIVE (mg/dL)   Urobilinogen, UA 0.2  0.0 - 1.0 (mg/dL)   Nitrite NEGATIVE  NEGATIVE    Leukocytes, UA NEGATIVE  NEGATIVE   SALICYLATE LEVEL      Component Value Range   Salicylate Lvl 0.6 (*) 2.8 - 20.0 (mg/dL)  AMMONIA      Component Value Range   Ammonia 16  11 - 60 (umol/L)  BLOOD  GAS, ARTERIAL      Component Value Range     FIO2 0.21     pH, Arterial 7.424 (*) 7.350 - 7.400    pCO2 arterial 35.3  35.0 - 45.0 (mmHg)   pO2, Arterial 85.7  80.0 - 100.0 (mmHg)   Bicarbonate 22.7  20.0 - 24.0 (mEq/L)   TCO2 20.0  0 - 100 (mmol/L)   Acid-base deficit 1.1  0.0 - 2.0 (mmol/L)   O2 Saturation 97.2     Patient temperature 37.0     Collection site LEFT BRACHIAL     Drawn by COLLECTED BY RT     Sample type ARTERIAL     Allens test (pass/fail) NOT INDICATED (*) PASS   POCT PREGNANCY, URINE      Component Value Range   Preg Test, Ur NEGATIVE    LITHIUM LEVEL      Component Value Range   Lithium Lvl 1.12  0.80 - 1.40 (mEq/L)  URINE MICROSCOPIC-ADD ON      Component Value Range   Squamous Epithelial / LPF FEW (*) RARE    WBC, UA 0-2  <3 (WBC/hpf)   RBC / HPF 3-6  <3 (RBC/hpf)   Bacteria, UA RARE  RARE    Ct Head Wo Contrast 04/20/2011  *RADIOLOGY REPORT*  Clinical Data:  Altered mental status.  Confusion  CT HEAD WITHOUT CONTRAST  Technique:  Contiguous axial images were obtained from the base of the skull through the vertex without contrast  Comparison:  08/18/2008  Findings:  The brain has a normal appearance without evidence for hemorrhage, acute infarction, hydrocephalus, or mass lesion.  There is no extra axial fluid collection.  The skull and paranasal sinuses are normal.  IMPRESSION: Normal CT of the head without contrast.  Original Report Authenticated By: Camelia Phenes, M.D.   Ct Angio Chest W/cm &/or Wo Cm 04/20/2011  *RADIOLOGY REPORT*  Clinical Data: Confusion.  Altered mental status.  CT ANGIOGRAPHY CHEST  Technique:  Multidetector CT imaging of the chest using the standard protocol during bolus administration of intravenous contrast. Multiplanar reconstructed images including MIPs were obtained and reviewed to evaluate the vascular anatomy.  Contrast: OMNIPAQUE IOHEXOL 350 MG/ML IV SOLN  Comparison: None.  Findings: There is no pulmonary embolus.  Heart size is normal.  No pleural or pericardial  effusion.  No axillary, hilar or mediastinal lymphadenopathy.  The lungs are clear.  Incidentally imaged upper abdomen is unremarkable.  No focal bony abnormality.  IMPRESSION: Negative for pulmonary embolus.  Negative exam.  Original Report Authenticated By: Bernadene Bell. D'ALESSIO, M.D.    6:33 PM:  Pt eating at meal.  Perseverating over "my high blood pressure" and "my grandma has diabetes and so might I."  Rambling, tangential speech, does not stay on topic even when redirected.  Does not answer questions directly.  Now endorses she has been having "blacking out spells" over the past week, but will not speak further about it.  Also states she has been "under some more stress lately."    8:41 PM:  Pt now can state her name, but does not know her DOB or date/day of week.  Pt can however correctly say whom the president is, his wife and children's names.  Pt's grandfather now states that pt was "acting weird" this morning, she was "seeing things in the TV that weren't there" and "talking to people that weren't there."  Doubt medical cause for confusion/AMS at this time, likely psychosis.  ACT Samson Frederic here  for assessment now.    2200:  Pt needs admit for psychosis.   No 400 hall beds at [facility], will need to hold in ED until tomorrow.        Sevana Grandinetti Allison Quarry, DO 04/21/11 1355

## 2011-04-20 NOTE — ED Notes (Signed)
Family ledt and went home.  Family took all of pt's clothes, jewelry and purse with them.

## 2011-04-20 NOTE — ED Notes (Signed)
Pt placed in paper scrubs and items removed from pt.

## 2011-04-20 NOTE — ED Notes (Signed)
Pt in room with confused speech and flight of ideas

## 2011-04-20 NOTE — BH Assessment (Signed)
Assessment Note   MISK GALENTINE is an 26 y.o. female.  PT WAS TRANSPORTED TO THE ER AFTER SLUMPING OVER IN THE CAR WHICH HER GRANDFATHER WAS DRIVING. PT REPORTS NOT KNOWING WHERE SHE WAS OR WHO SHE WAS. PT'S GRANDFATHER REPORTED THIS MORNING PT WAS SEEING THINGS COMING OUT OF THE TV SUCH AS COCONUTS.  SHE ALSO HAS BEEN RESPONDING TO INTERNAL STIMULI BY TALKING TO SOMEONE WHO IS NOT PRESENT. PT IS ALSO DELUSIONAL AS SHE HAS REPORTED ILLNESSES OF HER MOM AND GRANDMOTHER AS BEING HER OWN. SHE REPORTED GOING TO THE HOSPITAL RECENTLY FOR A PLATELET  COUNT AND BODY WEAKNESS WHEN IN FACT IT WAS HER MOTHER WHO DID THIS.  SHE REPORTS BEING DIABETIC BUT IT'S HER MOTHER AND GRANDMOTHER THAT'S DIABETIC. PT IS UNABLE TO TELL WHAT THE DATE IS AND WHAT DAY OF THE WEEK IT IS. HER GRANDFATHER REPORTS SHE HAS BEEN STRESSED ABOUT HER SON 4TH GRADE FAILURE AND IS DOING POORLY IN SCHOOL THIS YEAR. AND IS OBSESSING ABOUT HOW SHE CAN GET HIM TO IMPROVE.         Axis I: Psychotic Disorder NOS Axis II: Deferred Axis III:  Past Medical History  Diagnosis Date  . Hypertension   . Migraine headache   . Depression   . Irritable bowel syndrome (IBS)    Axis IV: other psychosocial or environmental problems Axis V: 21-30 behavior considerably influenced by delusions or hallucinations OR serious impairment in judgment, communication OR inability to function in almost all areas  Past Medical History:  Past Medical History  Diagnosis Date  . Hypertension   . Migraine headache   . Depression   . Irritable bowel syndrome (IBS)     History reviewed. No pertinent past surgical history.  Family History: No family history on file.  Social History:  reports that she has been smoking Cigarettes.  She does not have any smokeless tobacco history on file. Her alcohol and drug histories not on file.  Additional Social History:    Allergies: No Known Allergies  Home Medications:  Medications Prior to Admission    Medication Dose Route Frequency Provider Last Rate Last Dose  . 0.9 %  sodium chloride infusion   Intravenous Continuous Laray Anger, DO 125 mL/hr at 04/20/11 1635    . iohexol (OMNIPAQUE) 350 MG/ML injection 100 mL  100 mL Intravenous Once PRN Medication Radiologist, MD   100 mL at 04/20/11 1728   No current outpatient prescriptions on file as of 04/20/2011.    OB/GYN Status:  No LMP recorded.  General Assessment Data Location of Assessment: [facility] ACT Assessment: Yes Living Arrangements: Family members (grandparents) Can pt return to current living arrangement?: Yes Admission Status: Voluntary Is patient capable of signing voluntary admission?: Yes Transfer from: Acute Hospital Referral Source: MD Pattricia Boss [facility])  Education Status Contact person: CALLEEN ALVIS [phone]  Risk to self Suicidal Ideation: No Suicidal Intent: No Is patient at risk for suicide?: No Suicidal Plan?: No Access to Means: No What has been your use of drugs/alcohol within the last 12 months?: NA Previous Attempts/Gestures: No How many times?: 0  Other Self Harm Risks: NA Triggers for Past Attempts: Other (Comment) (NONE) Intentional Self Injurious Behavior: None Family Suicide History: No Recent stressful life event(s): Other (Comment) (SON'S FAILING THE 4TH GRADE AND POOR GRADES) Persecutory voices/beliefs?: No Depression: Yes Depression Symptoms: Isolating;Loss of interest in usual pleasures;Feeling worthless/self pity Substance abuse history and/or treatment for substance abuse?: Yes Suicide prevention information given to non-admitted  patients: Not applicable  Risk to Others Homicidal Ideation: No Thoughts of Harm to Others: No Current Homicidal Intent: No Current Homicidal Plan: No Access to Homicidal Means: No Identified Victim: NA History of harm to others?: No Assessment of Violence: None Noted Violent Behavior Description: NA Does patient have access to weapons?:  No Criminal Charges Pending?: No Does patient have a court date: No  Psychosis Hallucinations: Auditory;Visual Delusions: Unspecified (THINKS SHE HAS ALL THE MEDICAL PROBLEMS HER MOM AND GRAND MO)  Mental Status Report Appear/Hygiene: Improved Eye Contact: Good Motor Activity: Freedom of movement Speech: Incoherent;Pressured;Word salad;Tangential Level of Consciousness: Quiet/awake Mood: Depressed;Helpless;Sad Affect: Depressed;Frightened;Sad;Appropriate to circumstance Anxiety Level: Minimal Thought Processes: Tangential;Flight of Ideas Judgement: Impaired Orientation: Person;Place Obsessive Compulsive Thoughts/Behaviors: None  Cognitive Functioning Concentration: Decreased Memory: Recent Impaired;Remote Impaired IQ: Average Insight: Poor Impulse Control: Poor Appetite: Fair Sleep: No Change Total Hours of Sleep: 8  Vegetative Symptoms: None  Prior Inpatient Therapy Prior Inpatient Therapy: No  Prior Outpatient Therapy Prior Outpatient Therapy: Yes Prior Therapy Dates: CURRENTLY Prior Therapy Facilty/Provider(s): DR HEADEN-[place] Reason for Treatment: DEPRESSION, BIPOLAR, ANXIETY            Values / Beliefs Cultural Requests During Hospitalization: None Spiritual Requests During Hospitalization: None        Additional Information 1:1 In Past 12 Months?: No CIRT Risk: No Elopement Risk: No Does patient have medical clearance?: Yes     Disposition:  REFERRED TO [facility].  [facility] AGREES WITH DISPOSITION.      Disposition Disposition of Patient: Inpatient treatment program Type of inpatient treatment program: Adult  On Site Evaluation by:   Reviewed with Physician:     Hattie Perch Winford 04/20/2011 9:10 PM

## 2011-04-20 NOTE — ED Notes (Signed)
Patient ambulatory to restroom  ?

## 2011-04-20 NOTE — ED Notes (Signed)
Pt resting in bed at present still talking incoherently.

## 2011-04-20 NOTE — ED Notes (Signed)
Patient transported to CT 

## 2011-04-20 NOTE — ED Notes (Signed)
Pt resting in bed with family at bedside.  Pt talking incoherently.

## 2011-04-21 MED ORDER — LORAZEPAM 1 MG PO TABS: 1.0000 mg | ORAL_TABLET | Freq: Three times a day (TID) | ORAL | Status: DC | PRN

## 2011-04-21 MED ORDER — ONDANSETRON HCL 4 MG PO TABS: 4.0000 mg | ORAL_TABLET | Freq: Three times a day (TID) | ORAL | Status: DC | PRN

## 2011-04-21 MED ORDER — ACETAMINOPHEN 325 MG PO TABS: 650.0000 mg | ORAL_TABLET | ORAL | Status: DC | PRN

## 2011-04-21 NOTE — BH Assessment (Signed)
Assessment Note   [name] Whitney is an 26 y.o. female. Patient has remained in the ED overnight. She remains psychotic. She is hearing a "voice" but she does not understand what is said. She has little eye contact and at times does not answer questions. She is pleasant and cooperative. [facility] staff has requested that a tele-psych be ordered.  Axis I: Bipolar, Depressed and Schizoaffective Disorder Axis II: Deferred Axis III:  Past Medical History  Diagnosis Date  . Hypertension   . Migraine headache   . Depression   . Irritable bowel syndrome (IBS)    Axis IV: problems with access to health care services Axis V: 21-30 behavior considerably influenced by delusions or hallucinations OR serious impairment in judgment, communication OR inability to function in almost all areas  Past Medical History:  Past Medical History  Diagnosis Date  . Hypertension   . Migraine headache   . Depression   . Irritable bowel syndrome (IBS)     History reviewed. No pertinent past surgical history.  Family History: No family history on file.  Social History:  reports that she has been smoking Cigarettes.  She does not have any smokeless tobacco history on file. Her alcohol and drug histories not on file.  Additional Social History:    Allergies: No Known Allergies  Home Medications:  Medications Prior to Admission  Medication Dose Route Frequency Provider Last Rate Last Dose  . 0.9 %  sodium chloride infusion   Intravenous Continuous Laray Anger, DO      . acetaminophen (TYLENOL) tablet 650 mg  650 mg Oral Q4H PRN Joya Gaskins, MD      . iohexol (OMNIPAQUE) 350 MG/ML injection 100 mL  100 mL Intravenous Once PRN Medication Radiologist, MD   100 mL at 04/20/11 1728  . LORazepam (ATIVAN) tablet 1 mg  1 mg Oral Q8H PRN Joya Gaskins, MD      . ondansetron [facility]) tablet 4 mg  4 mg Oral Q8H PRN Joya Gaskins, MD       No current outpatient prescriptions on file as of 04/20/2011.     OB/GYN Status:  No LMP recorded.  General Assessment Data Location of Assessment: [facility] ACT Assessment: Yes Living Arrangements: Family members (grandparents) Can pt return to current living arrangement?: Yes Admission Status: Voluntary Is patient capable of signing voluntary admission?: Yes Transfer from: Acute Hospital Referral Source: MD  Education Status Is patient currently in school?: No Contact person: ANNI HOCEVAR [phone]  Risk to self Suicidal Ideation: No Suicidal Intent: No Is patient at risk for suicide?: No Suicidal Plan?: No Access to Means: No What has been your use of drugs/alcohol within the last 12 months?: denies Previous Attempts/Gestures: No How many times?: 0  Other Self Harm Risks: NA Triggers for Past Attempts: None known Intentional Self Injurious Behavior: None Family Suicide History: No Recent stressful life event(s): Other (Comment) (son's school issues) Persecutory voices/beliefs?: No Depression: Yes Depression Symptoms: Isolating;Loss of interest in usual pleasures;Feeling worthless/self pity Substance abuse history and/or treatment for substance abuse?: No Suicide prevention information given to non-admitted patients: Not applicable  Risk to Others Homicidal Ideation: No Thoughts of Harm to Others: No Current Homicidal Intent: No Current Homicidal Plan: No Access to Homicidal Means: No Identified Victim: NA History of harm to others?: No Assessment of Violence: None Noted Violent Behavior Description: NA Does patient have access to weapons?: No Criminal Charges Pending?: No Does patient have a court date: No  Psychosis Hallucinations: Auditory (hears "voice" cannot understand) Delusions: None noted  Mental Status Report Appear/Hygiene: Improved Eye Contact: Poor Motor Activity: Other (Comment) Speech: Aggressive;Soft;Slow Level of Consciousness: Quiet/awake Mood: Depressed Affect: Depressed;Sad Anxiety  Level: Minimal Thought Processes: Coherent;Circumstantial Judgement: Impaired Orientation: Person;Place Obsessive Compulsive Thoughts/Behaviors: None  Cognitive Functioning Concentration: Decreased Memory: Recent Impaired;Remote Impaired IQ: Average Insight: Poor Impulse Control: Poor Appetite: Fair Sleep: No Change Total Hours of Sleep: 8  Vegetative Symptoms: None  Prior Inpatient Therapy Prior Inpatient Therapy: No  Prior Outpatient Therapy Prior Outpatient Therapy: Yes Prior Therapy Dates: 2012-2013 Prior Therapy Facilty/Provider(s): DR HEADEN-[place] Reason for Treatment: DEPRESSION, BIPOLAR, ANXIETY            Values / Beliefs Cultural Requests During Hospitalization: None Spiritual Requests During Hospitalization: None        Additional Information 1:1 In Past 12 Months?: No CIRT Risk: No Elopement Risk: No Does patient have medical clearance?: Yes     Disposition: Patient was ordered  For a tele-psych. This was completed. Patient also referred to [facility].  Patient has been accepted by for admission by Dr Jeannine Kitten. She is now IVC due to altered mental status. Transport will be by [facility] to [facility]. [facility] paperwork completed along with support paperwork. Dr Richarda Osmond is in agreement with the diposition. Disposition Disposition of Patient: Inpatient treatment program Type of inpatient treatment program: Adult  On Site Evaluation by:   Reviewed with Physician:     Jearld Pies 04/21/2011 12:42 PM

## 2011-04-21 NOTE — ED Notes (Signed)
Pt did not eat any of her breakfast tray. Sleeping at present.

## 2011-04-21 NOTE — ED Notes (Signed)
Pt alert and oriented to person, place and time. Pt able to verbalize name, year and where she is. Pt also able to verbalize short answers with no difficulty and appropriately. Pt unable to verbalize if asked complex questions and states that her thoughts are scrambled. Denies pain. Sitting on stretcher quietly.

## 2011-04-21 NOTE — ED Notes (Signed)
Patient is comfortable at this time. 

## 2011-04-21 NOTE — ED Notes (Signed)
Report given to [facility] at [facility]. Waiting for PD for transport.

## 2011-04-21 NOTE — ED Provider Notes (Signed)
Telepsych after report was obtained. He recommended admission. Arrangements are made to transfer the patient to [facility] to Dr. Charma Igo, MD 04/21/11 [phone]

## 2011-04-21 NOTE — ED Notes (Signed)
Received report agree with previous assessment 

## 2011-04-21 NOTE — ED Notes (Signed)
Receive call from [facility]; Per [facility] assessment intake, pt not medically cleared to be admitted. Per Hadley Pen, PA stated that pt needed to stay at hospital another 24 hours to be clear medically to confirm if condition psychotic vs medical condition. Will inform ED MD.

## 2011-04-21 NOTE — ED Notes (Signed)
Telepsych conference done with Dr. Malachy Mood. Pt sitting on stretcher with no complaints. Pt able to verbalize with less difficulty at this time. Denies hallucinations at this time.

## 2011-04-21 NOTE — ED Notes (Signed)
Pt sleeping but moving about on stretcher.

## 2011-05-21 ENCOUNTER — Emergency Department (HOSPITAL_COMMUNITY)
Admission: EM | Admit: 2011-05-21 | Discharge: 2011-05-22 | Disposition: A | Discharge status: Home / Self Care | Payer: Medicaid Other | Attending: Emergency Medicine | Admitting: Emergency Medicine

## 2011-05-21 ENCOUNTER — Encounter (HOSPITAL_COMMUNITY): Payer: Self-pay | Admitting: *Deleted

## 2011-05-21 DIAGNOSIS — I10 Essential (primary) hypertension: Secondary | ICD-10-CM | POA: Insufficient documentation

## 2011-05-21 DIAGNOSIS — IMO0002 Reserved for concepts with insufficient information to code with codable children: Secondary | ICD-10-CM | POA: Insufficient documentation

## 2011-05-21 DIAGNOSIS — K589 Irritable bowel syndrome without diarrhea: Secondary | ICD-10-CM | POA: Insufficient documentation

## 2011-05-21 DIAGNOSIS — F319 Bipolar disorder, unspecified: Secondary | ICD-10-CM | POA: Insufficient documentation

## 2011-05-21 DIAGNOSIS — Z046 Encounter for general psychiatric examination, requested by authority: Secondary | ICD-10-CM | POA: Insufficient documentation

## 2011-05-21 HISTORY — DX: Bipolar disorder, unspecified: F31.9

## 2011-05-21 LAB — URINALYSIS, ROUTINE W REFLEX MICROSCOPIC
Bilirubin Urine: NEGATIVE
Ketones, ur: NEGATIVE mg/dL
Nitrite: NEGATIVE
Urobilinogen, UA: 0.2 mg/dL (ref 0.0–1.0)

## 2011-05-21 LAB — CBC
HCT: 35.3 % — ABNORMAL LOW (ref 36.0–46.0)
MCV: 94.6 fL (ref 78.0–100.0)
RBC: 3.73 MIL/uL — ABNORMAL LOW (ref 3.87–5.11)
WBC: 9.2 10*3/uL (ref 4.0–10.5)

## 2011-05-21 NOTE — ED Notes (Signed)
Brought in for evaluation by [facility]
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
## 2011-05-21 NOTE — ED Notes (Signed)
[facility] left pt. Sitter at bedside
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
## 2011-05-22 LAB — COMPREHENSIVE METABOLIC PANEL
Alkaline Phosphatase: 55 U/L (ref 39–117)
BUN: 13 mg/dL (ref 6–23)
Chloride: 103 mEq/L (ref 96–112)
GFR calc Af Amer: >90 mL/min (ref >90)
Glucose, Bld: 98 mg/dL (ref 70–99)
Potassium: 3.2 mEq/L — ABNORMAL LOW (ref 3.5–5.1)
Total Bilirubin: 0.3 mg/dL (ref 0.3–1.2)

## 2011-05-22 LAB — ETHANOL: Alcohol, Ethyl (B): <11 mg/dL (ref 0–11)

## 2011-05-22 NOTE — ED Provider Notes (Signed)
0010 Assumed care/disposition of patient. Here with IVC paperwork.  Awaiting Telepsych . 46 Spoke with Dr. Berlin Hun, She will be conducting the telepsych evaluation. 91 Spoke with Dr.Baralt.  She advised evaluation was complete. Her recommendation is th house the patient overnight and see how she does. Re-evaluation by telepsych tomorrow morning.  Nicoletta Dress. Colon Branch, MD 05/22/11 [phone]

## 2011-05-22 NOTE — ED Notes (Signed)
Another Telepsych done at about 0915 this am.  Dr. Berlin Hun called earlier and spoke with Dr. Radford Pax.
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
## 2011-05-22 NOTE — ED Notes (Signed)
Pt doing telepsych at this time.  

## 2011-05-22 NOTE — ED Notes (Signed)
Pt sleeping. 

## 2011-05-22 NOTE — ED Provider Notes (Signed)
Patient was reevaluated after evaluation she made recommendation that the IVC papers be rescinded.  Patient was released and told to return as needed.  Nelia Shi, MD 05/22/11 [phone]

## 2011-05-22 NOTE — Discharge Instructions (Signed)
The psychiatrist saw you recommends the following:  #1 lithium 300 mg twice a day  #2 decrease your Prozac to 20 mg every night

## 2011-05-22 NOTE — ED Notes (Signed)
Pt was advised by telepsych PA to rest here overnight and be reevaluated in the morning.

## 2011-05-23 ENCOUNTER — Encounter (HOSPITAL_COMMUNITY): Payer: Self-pay | Admitting: Emergency Medicine

## 2011-05-23 ENCOUNTER — Emergency Department (HOSPITAL_COMMUNITY)
Admission: EM | Admit: 2011-05-23 | Discharge: 2011-05-25 | Disposition: A | Discharge status: Other Acute Inpatient Hospital | Payer: Medicaid Other | Attending: Emergency Medicine | Admitting: Emergency Medicine

## 2011-05-23 ENCOUNTER — Emergency Department (HOSPITAL_COMMUNITY): Payer: Medicaid Other

## 2011-05-23 DIAGNOSIS — K589 Irritable bowel syndrome without diarrhea: Secondary | ICD-10-CM | POA: Insufficient documentation

## 2011-05-23 DIAGNOSIS — Z79899 Other long term (current) drug therapy: Secondary | ICD-10-CM | POA: Insufficient documentation

## 2011-05-23 DIAGNOSIS — I10 Essential (primary) hypertension: Secondary | ICD-10-CM | POA: Insufficient documentation

## 2011-05-23 DIAGNOSIS — F319 Bipolar disorder, unspecified: Secondary | ICD-10-CM | POA: Insufficient documentation

## 2011-05-23 DIAGNOSIS — T07XXXA Unspecified multiple injuries, initial encounter: Secondary | ICD-10-CM

## 2011-05-23 DIAGNOSIS — W108XXA Fall (on) (from) other stairs and steps, initial encounter: Secondary | ICD-10-CM | POA: Insufficient documentation

## 2011-05-23 DIAGNOSIS — M79609 Pain in unspecified limb: Secondary | ICD-10-CM | POA: Insufficient documentation

## 2011-05-23 LAB — DIFFERENTIAL
Basophils Absolute: 0 10*3/uL (ref 0.0–0.1)
Basophils Relative: 0 % (ref 0–1)
Monocytes Relative: 6 % (ref 3–12)
Neutro Abs: 8.2 10*3/uL — ABNORMAL HIGH (ref 1.7–7.7)
Neutrophils Relative %: 69 % (ref 43–77)

## 2011-05-23 LAB — BASIC METABOLIC PANEL
BUN: 12 mg/dL (ref 6–23)
Chloride: 105 mEq/L (ref 96–112)
GFR calc Af Amer: >90 mL/min (ref >90)
Potassium: 3.5 mEq/L (ref 3.5–5.1)
Sodium: 141 mEq/L (ref 135–145)

## 2011-05-23 LAB — CBC
Hemoglobin: 12 g/dL (ref 12.0–15.0)
MCHC: 33.2 g/dL (ref 30.0–36.0)
Platelets: 222 10*3/uL (ref 150–400)
RBC: 3.86 MIL/uL — ABNORMAL LOW (ref 3.87–5.11)

## 2011-05-23 LAB — LITHIUM LEVEL: Lithium Lvl: 0.49 mEq/L — ABNORMAL LOW (ref 0.80–1.40)

## 2011-05-23 LAB — RAPID URINE DRUG SCREEN, HOSP PERFORMED
Barbiturates: NOT DETECTED
Cocaine: NOT DETECTED
Tetrahydrocannabinol: NOT DETECTED

## 2011-05-23 MED ORDER — ONDANSETRON HCL 8 MG PO TABS: 4.0000 mg | ORAL_TABLET | Freq: Three times a day (TID) | ORAL | Status: DC | PRN

## 2011-05-23 MED ORDER — IBUPROFEN 200 MG PO TABS
600.0000 mg | ORAL_TABLET | Freq: Three times a day (TID) | ORAL | Status: DC | PRN
  Administered 2011-05-24 – 2011-05-25 (×2): 600 mg via ORAL
  Filled 2011-05-23 (×2): qty 3

## 2011-05-23 MED ORDER — LITHIUM CARBONATE ER 300 MG PO TBCR
300.0000 mg | EXTENDED_RELEASE_TABLET | Freq: Every day | ORAL | Status: DC
  Administered 2011-05-23 – 2011-05-25 (×3): 300 mg via ORAL
  Filled 2011-05-23 (×4): qty 1

## 2011-05-23 MED ORDER — ZOLPIDEM TARTRATE 5 MG PO TABS
5.0000 mg | ORAL_TABLET | Freq: Every evening | ORAL | Status: DC | PRN
  Administered 2011-05-24 (×2): 5 mg via ORAL
  Filled 2011-05-23 (×2): qty 1

## 2011-05-23 MED ORDER — FLUOXETINE HCL 20 MG PO CAPS
80.0000 mg | ORAL_CAPSULE | Freq: Every day | ORAL | Status: DC
  Administered 2011-05-24 – 2011-05-25 (×2): 80 mg via ORAL
  Filled 2011-05-23 (×2): qty 4

## 2011-05-23 MED ORDER — PROPRANOLOL HCL 20 MG PO TABS
20.0000 mg | ORAL_TABLET | Freq: Two times a day (BID) | ORAL | Status: DC
  Administered 2011-05-23 – 2011-05-25 (×3): 20 mg via ORAL
  Filled 2011-05-23 (×4): qty 1

## 2011-05-23 MED ORDER — LORAZEPAM 1 MG PO TABS
1.0000 mg | ORAL_TABLET | Freq: Three times a day (TID) | ORAL | Status: DC | PRN
  Administered 2011-05-24 (×2): 1 mg via ORAL
  Filled 2011-05-23 (×2): qty 1

## 2011-05-23 MED ORDER — IBUPROFEN 800 MG PO TABS
800.0000 mg | ORAL_TABLET | Freq: Once | ORAL | Status: AC
  Administered 2011-05-23: 800 mg via ORAL
  Filled 2011-05-23: qty 1

## 2011-05-23 MED ORDER — NICOTINE 21 MG/24HR TD PT24
21.0000 mg | MEDICATED_PATCH | Freq: Every day | TRANSDERMAL | Status: DC
  Administered 2011-05-23 – 2011-05-25 (×2): 21 mg via TRANSDERMAL
  Filled 2011-05-23 (×2): qty 1

## 2011-05-23 MED ORDER — ALPRAZOLAM 0.5 MG PO TABS
1.0000 mg | ORAL_TABLET | Freq: Four times a day (QID) | ORAL | Status: DC
Start: 2011-05-23 — End: 2011-05-25
  Administered 2011-05-23 – 2011-05-25 (×5): 1 mg via ORAL
  Filled 2011-05-23: qty 2
  Filled 2011-05-23: qty 1
  Filled 2011-05-23 (×2): qty 2
  Filled 2011-05-23: qty 1
  Filled 2011-05-23: qty 2

## 2011-05-23 MED ORDER — FLUOXETINE HCL 40 MG PO CAPS
80.0000 mg | ORAL_CAPSULE | Freq: Every day | ORAL | Status: DC
Start: 2011-05-23 — End: 2011-05-23

## 2011-05-23 MED ORDER — ACETAMINOPHEN 325 MG PO TABS: 650.0000 mg | ORAL_TABLET | ORAL | Status: DC | PRN

## 2011-05-23 MED ORDER — PANTOPRAZOLE SODIUM 40 MG PO TBEC
40.0000 mg | DELAYED_RELEASE_TABLET | Freq: Every day | ORAL | Status: DC
  Administered 2011-05-23 – 2011-05-25 (×3): 40 mg via ORAL
  Filled 2011-05-23 (×3): qty 1
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
## 2011-05-23 NOTE — ED Notes (Signed)
Family in [place], have questions.

## 2011-05-23 NOTE — ED Notes (Signed)
Pt here for medical clearance.  Denies suicidal ideation and homicidal ideation.  Pt reports that her medicine has been changed a couple of times due to side effects.  Pt reports that she usually has her son during the week and he stays with her parents over the weekend.  States her parents would not let her son come back today because she did not have her food stamps.  Pt states she has been "flipping out" due to not getting her son back and that she does not have any medication to take because her Grandmother is holding onto it.  States she had Xanax at 1pm and that the [facility] had to have Grandmother give her the medication at that time.

## 2011-05-23 NOTE — ED Notes (Signed)
[facility] conference completed.
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
## 2011-05-23 NOTE — ED Notes (Signed)
Security paged to wand this pt.
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
## 2011-05-23 NOTE — ED Notes (Signed)
Security responded  To wand pt.

## 2011-05-23 NOTE — ED Notes (Signed)
Family in [place].  Pt does not want Grandfather to visit,  Grandmother ok to visit.

## 2011-05-24 MED ORDER — ZIPRASIDONE MESYLATE 20 MG IM SOLR
20.0000 mg | Freq: Four times a day (QID) | INTRAMUSCULAR | Status: DC | PRN
  Administered 2011-05-24: 20 mg via INTRAMUSCULAR
  Filled 2011-05-24: qty 20

## 2011-05-24 NOTE — ED Notes (Signed)
Holding 1000 meds because pt difficult to arouse due to geodon administration.
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
## 2011-05-24 NOTE — ED Notes (Signed)
Pt out to desk tearful. Stating she wants to leave and her Dad can come by to pick her up. Explained to patient she is not discharged and psychiatrist reccommended pt receive inpatient psych treatment. Patient then became upset stating she was told yesterday by telepsych doctor that she was no longer IVC. Explained to patient that if she attempted to leave IVC papers would be reissued on patients behalf. Pt tearful, and upset she hasn't seen her son in a week and she thinks her grandparents are trying to take him away from her. Pt requesting a "shot to knock her out". Pt medicated with geodon per patient request. Remains cooperative, calm, staying in room. At times with outbursts of yelling while in room alone but not posing flight risk at this time.

## 2011-05-24 NOTE — ED Notes (Signed)
Staff  Went  With pt  To 4500 . Pt  Took a shower and washer her hair.
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
## 2011-05-25 NOTE — ED Notes (Signed)
Breakfast tray ordered @ 0530

## 2011-05-25 NOTE — ED Provider Notes (Signed)
Patient accepted at [facility] hospital by Dr. Otelia Santee.  She is in no distress. Vital signs stable  BP 121/84  Pulse 72  Temp(Src) 97 F (36.1 C) (Oral)  Resp 16  Ht 5\' 8"  (1.727 m)  Wt 155 lb (70.308 kg)  BMI 23.57 kg/m2  SpO2 100%  LMP 04/20/2011   Glynn Octave, MD 05/25/11 1006

## 2011-06-07 ENCOUNTER — Encounter (HOSPITAL_COMMUNITY): Payer: Self-pay | Admitting: *Deleted

## 2011-06-07 ENCOUNTER — Emergency Department (HOSPITAL_COMMUNITY)
Admission: EM | Admit: 2011-06-07 | Discharge: 2011-06-08 | Disposition: A | Discharge status: Other Acute Inpatient Hospital | Payer: Medicaid Other | Source: Home / Self Care | Attending: Emergency Medicine | Admitting: Emergency Medicine

## 2011-06-07 DIAGNOSIS — F419 Anxiety disorder, unspecified: Secondary | ICD-10-CM

## 2011-06-07 DIAGNOSIS — I10 Essential (primary) hypertension: Secondary | ICD-10-CM | POA: Insufficient documentation

## 2011-06-07 DIAGNOSIS — F329 Major depressive disorder, single episode, unspecified: Secondary | ICD-10-CM | POA: Insufficient documentation

## 2011-06-07 DIAGNOSIS — F411 Generalized anxiety disorder: Secondary | ICD-10-CM | POA: Insufficient documentation

## 2011-06-07 DIAGNOSIS — F3289 Other specified depressive episodes: Secondary | ICD-10-CM | POA: Insufficient documentation

## 2011-06-07 LAB — POCT I-STAT, CHEM 8
Glucose, Bld: 123 mg/dL — ABNORMAL HIGH (ref 70–99)
HCT: 40 % (ref 36.0–46.0)
Hemoglobin: 13.6 g/dL (ref 12.0–15.0)
Potassium: 3.6 mEq/L (ref 3.5–5.1)
Sodium: 142 mEq/L (ref 135–145)
TCO2: 28 mmol/L (ref 0–100)

## 2011-06-07 LAB — CBC
HCT: 36.9 % (ref 36.0–46.0)
MCHC: 32.8 g/dL (ref 30.0–36.0)
MCV: 94.6 fL (ref 78.0–100.0)
RDW: 13.1 % (ref 11.5–15.5)

## 2011-06-07 LAB — RAPID URINE DRUG SCREEN, HOSP PERFORMED
Amphetamines: NOT DETECTED
Barbiturates: NOT DETECTED
Tetrahydrocannabinol: NOT DETECTED

## 2011-06-07 MED ORDER — HALOPERIDOL 5 MG PO TABS
5.0000 mg | ORAL_TABLET | Freq: Two times a day (BID) | ORAL | Status: DC
  Administered 2011-06-07 – 2011-06-08 (×3): 5 mg via ORAL
  Filled 2011-06-07: qty 2
  Filled 2011-06-07 (×2): qty 1

## 2011-06-07 MED ORDER — ACETAMINOPHEN 325 MG PO TABS: 650.0000 mg | ORAL_TABLET | ORAL | Status: DC | PRN

## 2011-06-07 MED ORDER — BENZTROPINE MESYLATE 1 MG PO TABS
1.0000 mg | ORAL_TABLET | Freq: Two times a day (BID) | ORAL | Status: DC
Start: 2011-06-07 — End: 2011-06-08
  Administered 2011-06-07 – 2011-06-08 (×3): 1 mg via ORAL
  Filled 2011-06-07 (×3): qty 1

## 2011-06-07 MED ORDER — ZOLPIDEM TARTRATE 5 MG PO TABS: 5.0000 mg | ORAL_TABLET | Freq: Every evening | ORAL | Status: DC | PRN

## 2011-06-07 MED ORDER — PROPRANOLOL HCL 20 MG PO TABS
20.0000 mg | ORAL_TABLET | Freq: Two times a day (BID) | ORAL | Status: DC
  Administered 2011-06-07 – 2011-06-08 (×2): 20 mg via ORAL
  Filled 2011-06-07 (×7): qty 1

## 2011-06-07 MED ORDER — PANTOPRAZOLE SODIUM 40 MG PO TBEC
40.0000 mg | DELAYED_RELEASE_TABLET | Freq: Every day | ORAL | Status: DC
  Administered 2011-06-07 – 2011-06-08 (×2): 40 mg via ORAL
  Filled 2011-06-07 (×2): qty 1

## 2011-06-07 MED ORDER — LORAZEPAM 1 MG PO TABS
1.0000 mg | ORAL_TABLET | Freq: Three times a day (TID) | ORAL | Status: DC | PRN
  Administered 2011-06-08: 1 mg via ORAL
  Filled 2011-06-07: qty 1

## 2011-06-07 MED ORDER — LURASIDONE HCL 40 MG PO TABS
40.0000 mg | ORAL_TABLET | Freq: Every day | ORAL | Status: DC
  Administered 2011-06-08: 40 mg via ORAL
  Filled 2011-06-07 (×3): qty 1

## 2011-06-07 NOTE — ED Provider Notes (Signed)
History     CSN: [id number]  Arrival date & time 06/07/11  1317   First MD Initiated Contact with Patient 06/07/11 1439      Chief Complaint  Patient presents with  . Depression  . Anxiety    (Consider location/radiation/quality/duration/timing/severity/associated sxs/prior treatment) Patient is a 26 y.o. female presenting with anxiety. The history is provided by the patient.  Anxiety This is a chronic problem. The current episode started more than 1 month ago. The problem occurs constantly. The problem has been unchanged. Pertinent negatives include no chills or fever. Associated symptoms comments: She reports recent hospitalization at [facility] for depression and feels she was released to early because she is still depressed and does not feel she can function per her usual due to fatigue and somnolence. She denies HI/SI, hallucinations.. The symptoms are aggravated by nothing.    Past Medical History  Diagnosis Date  . Hypertension   . Migraine headache   . Depression   . Irritable bowel syndrome (IBS)   . Bipolar 1 disorder   . Depression     History reviewed. No pertinent past surgical history.  No family history on file.  History  Substance Use Topics  . Smoking status: Current Everyday Smoker    Types: Cigarettes  . Smokeless tobacco: Not on file  . Alcohol Use: No    OB History    Grav Para Term Preterm Abortions TAB SAB Ect Mult Living                  Review of Systems  Constitutional: Negative for fever and chills.  HENT: Negative.   Respiratory: Negative.   Cardiovascular: Negative.   Gastrointestinal: Negative.   Musculoskeletal: Negative.   Skin: Negative.   Neurological: Negative.   Psychiatric/Behavioral: Positive for sleep disturbance and dysphoric mood.    Allergies  Compazine; Phenergan; and Risperidone and related  Home Medications   Current Outpatient Rx  Name Route Sig Dispense Refill  . BENZTROPINE MESYLATE 1 MG PO  TABS Oral Take 1 mg by mouth 2 (two) times daily.    Marland Kitchen HALOPERIDOL 5 MG PO TABS Oral Take 5-10 mg by mouth 2 (two) times daily.    Marland Kitchen LORAZEPAM 2 MG PO TABS Oral Take 2 mg by mouth at bedtime.    Marland Kitchen LURASIDONE HCL 80 MG PO TABS Oral Take 40 mg by mouth daily with breakfast. Takes 1/2 tablet    . PANTOPRAZOLE SODIUM 40 MG PO TBEC Oral Take 40 mg by mouth daily.    Marland Kitchen PROPRANOLOL HCL 20 MG PO TABS Oral Take 20 mg by mouth 2 (two) times daily.      BP 138/95  Pulse 71  Temp(Src) 98 F (36.7 C) (Oral)  Resp 16  Ht 5\' 8"  (1.727 m)  Wt 150 lb (68.04 kg)  BMI 22.81 kg/m2  SpO2 97%  LMP 04/20/2011  Physical Exam  Constitutional: She appears well-developed and well-nourished.  HENT:  Head: Normocephalic.  Neck: Normal range of motion. Neck supple.  Cardiovascular: Normal rate and regular rhythm.   Pulmonary/Chest: Effort normal and breath sounds normal.  Abdominal: Soft. Bowel sounds are normal. There is no tenderness. There is no rebound and no guarding.  Musculoskeletal: Normal range of motion.  Neurological: She is alert. No cranial nerve deficit.  Skin: Skin is warm and dry. No rash noted.  Psychiatric: She has a normal mood and affect.    ED Course  Procedures (including critical care time)  Labs Reviewed -  No data to display No results found.   No diagnosis found.    MDM  ACT to evaluate for possible medication adjustment, intensive outpatient therapy probably appropriate.        Rodena Medin, PA-C 06/07/11 1525

## 2011-06-07 NOTE — ED Notes (Signed)
Pt and belongings wanded by security. Pt has 1 black bookbag and 2 belongings bags.

## 2011-06-07 NOTE — ED Notes (Signed)
Pt states her psych medication were adjusted 2 days ago and since then has been feeling more lethargic, lack of energy, more depressed and anxious. Medications prescribed by Dr. Omelia Blackwater Psychologist. Pt staying at a shelter now and was brought up here by EMS. Denies SI/HI, just wants to have medications adjusted.
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
## 2011-06-07 NOTE — ED Notes (Addendum)
Pt states she has been depressed and is now homeless since her parents have moved to a facility where she cannot live.  Denies SI/HI.  Says that her psychiatrist has been making adjustments to her meds and the changes have not helped her.

## 2011-06-08 ENCOUNTER — Inpatient Hospital Stay (HOSPITAL_COMMUNITY)
Admission: AD | Admit: 2011-06-08 | Discharge: 2011-06-14 | DRG: 880 | Disposition: A | Discharge status: Home / Self Care | Payer: Medicaid Other | Source: Ambulatory Visit | Attending: Psychiatry | Admitting: Psychiatry

## 2011-06-08 ENCOUNTER — Encounter (HOSPITAL_COMMUNITY): Payer: Self-pay | Admitting: *Deleted

## 2011-06-08 DIAGNOSIS — F17200 Nicotine dependence, unspecified, uncomplicated: Secondary | ICD-10-CM

## 2011-06-08 DIAGNOSIS — G43909 Migraine, unspecified, not intractable, without status migrainosus: Secondary | ICD-10-CM

## 2011-06-08 DIAGNOSIS — F319 Bipolar disorder, unspecified: Secondary | ICD-10-CM

## 2011-06-08 DIAGNOSIS — K589 Irritable bowel syndrome without diarrhea: Secondary | ICD-10-CM

## 2011-06-08 DIAGNOSIS — Z5900 Homelessness unspecified: Secondary | ICD-10-CM

## 2011-06-08 DIAGNOSIS — Z79899 Other long term (current) drug therapy: Secondary | ICD-10-CM

## 2011-06-08 DIAGNOSIS — F410 Panic disorder [episodic paroxysmal anxiety] without agoraphobia: Principal | ICD-10-CM | POA: Diagnosis present

## 2011-06-08 DIAGNOSIS — I10 Essential (primary) hypertension: Secondary | ICD-10-CM

## 2011-06-08 DIAGNOSIS — Z888 Allergy status to other drugs, medicaments and biological substances status: Secondary | ICD-10-CM

## 2011-06-08 DIAGNOSIS — F4322 Adjustment disorder with anxiety: Secondary | ICD-10-CM

## 2011-06-08 MED ORDER — ALUM & MAG HYDROXIDE-SIMETH 200-200-20 MG/5ML PO SUSP
30.0000 mL | ORAL | Status: DC | PRN
  Filled 2011-06-08: qty 30

## 2011-06-08 MED ORDER — TRAZODONE HCL 50 MG PO TABS
50.0000 mg | ORAL_TABLET | Freq: Every evening | ORAL | Status: DC | PRN
  Filled 2011-06-08: qty 1

## 2011-06-08 MED ORDER — MAGNESIUM HYDROXIDE 400 MG/5ML PO SUSP
30.0000 mL | Freq: Every day | ORAL | Status: DC | PRN
  Filled 2011-06-08: qty 30

## 2011-06-08 MED ORDER — TRAZODONE HCL 50 MG PO TABS: 50.0000 mg | ORAL_TABLET | Freq: Every evening | ORAL | Status: DC | PRN

## 2011-06-08 MED ORDER — ACETAMINOPHEN 325 MG PO TABS
650.0000 mg | ORAL_TABLET | Freq: Four times a day (QID) | ORAL | Status: DC | PRN
  Administered 2011-06-09 – 2011-06-11 (×3): 650 mg via ORAL
  Filled 2011-06-08: qty 2

## 2011-06-08 NOTE — Progress Notes (Signed)
Psych ED Group  Facilitated group focused on grief and loss. Group discussed types of losses (re: death, job loss, loss of home/stability, loss of security, loss of sense of self) and identified grief reactions to loss. Group also discussed ways to cope/overcome struggles in life. Group was active and engaged w/ counselor and w/ one another, all members shared openly and supported one another.  Pt was active and engaged in group. Pt reported that she has been struggling to adjust to living in a shelter, identified this as a loss of stability. Pt reported that she has been having panic attacks, was tearful in group and stated "I feel like I've been tossed aside like garbage." Pt received support from fellow group members who also lived in shelters. Pt recognized strength she has in moving forward to create optimal future for self.  Jasun Gasparini B MS, LPCA, NCC

## 2011-06-08 NOTE — BHH Counselor (Signed)
Patient accepted by Dr. Wallis Mart to Dr. Dan Humphreys to room 505-2. Writer contacted EDP-Dr. Doylene Canard and made him aware of patients disposition. Wriiter completed all support paperwork for [facility]. Patient's nurse-Mike made aware of patients disposition and will call report. Kathlene November will also make all the arrangements for patient to be transported to [facility] accordingly.

## 2011-06-08 NOTE — H&P (Signed)
Medical/psychiatric screening examination/treatment/procedure(s) were performed by non-physician practitioner and as supervising physician I was available for consultation/collaboration.   

## 2011-06-08 NOTE — H&P (Signed)
Psychiatric Admission Assessment Adult  Patient Identification:  [name] Whitney Date of Evaluation:  06/08/2011 26 yo SWF   CC: Anxiety causing SOB  History of Present Illness: Presented to [facility] 3/11 c/o sob from anxiety/panic. Says she is homeless as her grandparents "kicked her out" 3 days ago. She also reports 2 recent hospitalizations at [facility] when she was having VH. Told intake person she was prescribed Latuda 40mg  QD Haldol 5-10 mg BID Cogentin 1mg  BID Ativan 1mg  Q8h Inderal 20 mg Q12h and Ambien 5mg  at hs.Said she was only taking the Ativan as she had run out of the other meds and never went for post discharge appointment.  Today she remains quite difficult to rouse made her get out of bed so she would answer questions but she had unusually poor eye contact and brief answers.  Past Psychiatric History: 2 recent hospitalizations at [facility] -told she was Bipolar   Substance Abuse History: None known and she had a neg UDS and no measuarable ETOH on admission   Social History:    reports that she has been smoking Cigarettes.  She has a 1.5 pack-year smoking history. She does not have any smokeless tobacco history on file. She reports that she drinks alcohol. She reports that she uses illicit drugs (Marijuana). She graduated HS 2005 has a 95 yo son who lives with her parents. 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 Income is $80/month child support.  Family Psych History: Denies   Past Medical History:     Past Medical History  Diagnosis Date  . Hypertension   . Migraine headache   . Depression   . Irritable bowel syndrome (IBS)   . Bipolar 1 disorder   . Depression       History reviewed. No pertinent past surgical history.  Allergies:  Allergies  Allergen Reactions  . Compazine Other (See Comments)    Muscle spasms  . Phenergan Other (See Comments)    Muscle spasms  . Risperidone And Related Hives    Current Medications:  Prior to Admission medications     Medication Sig Start Date End Date Taking? Authorizing Provider  benztropine (COGENTIN) 1 MG tablet Take 1 mg by mouth 2 (two) times daily.   Yes Historical Provider, MD  haloperidol (HALDOL) 5 MG tablet Take 5-10 mg by mouth 2 (two) times daily.   Yes Historical Provider, MD  propranolol (INDERAL) 20 MG tablet Take 20 mg by mouth 2 (two) times daily.   Yes Historical Provider, MD  LORazepam (ATIVAN) 2 MG tablet Take 2 mg by mouth at bedtime.    Historical Provider, MD  lurasidone (LATUDA) 80 MG TABS Take 40 mg by mouth daily with breakfast. Takes 1/2 tablet    Historical Provider, MD  pantoprazole (PROTONIX) 40 MG tablet Take 40 mg by mouth daily.    Historical Provider, MD    Mental Status Examination/Evaluation: Objective:  Appearance: Disheveled  Psychomotor Activity:  Decreased  Eye Contact::  Minimal  Speech:  Clear and Coherent  Volume:  Normal  Mood:  Anxious   Affect:  Restricted  Thought Process:    Orientation:  Full  Thought Content:  clear rational goal oriented -get out of shelter  Suicidal Thoughts:  No  Homicidal Thoughts:  No  Judgement:  Impaired  Insight:  Shallow    DIAGNOSIS:    AXIS I Adjustment Disorder with Anxiety homeless 3 days   AXIS II Deferred  AXIS III See medical  history.  AXIS IV economic problems, educational problems, housing problems, occupational problems, problems related to social environment and problems with primary support group  AXIS V 21-30 behavior considerably influenced by delusions or hallucinations OR serious impairment in judgment, communication OR inability to function in almost all areas   Treatment Plan Summary: Admit for safety & stabilization Get recent H&P/Discharge from [facility]  Not psychotic at present -wait till am to start meds   Agree with PE from ED.  Mickie Deery Maycen Degregory PA-C
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
## 2011-06-08 NOTE — Progress Notes (Signed)
Pt. Just recently admitted to the unit and keeps to herself for now in he room. Contracts for safety.

## 2011-06-08 NOTE — Tx Team (Signed)
Initial Interdisciplinary Treatment Plan  PATIENT STRENGTHS: (choose at least two) Ability for insight Active sense of humor Religious Affiliation  PATIENT STRESSORS: Financial difficulties Marital or family conflict Medication change or noncompliance   PROBLEM LIST: Problem List/Patient Goals Date to be addressed Date deferred Reason deferred Estimated date of resolution  SI 06-08-2011     depression 06-08-2011                                                DISCHARGE CRITERIA:  Adequate post-discharge living arrangements Improved stabilization in mood, thinking, and/or behavior  PRELIMINARY DISCHARGE PLAN: Outpatient therapy Placement in alternative living arrangements  PATIENT/FAMIILY INVOLVEMENT: This treatment plan has been presented to and reviewed with the patient, [name] Whitney, and/or family member, .  The patient and family have been given the opportunity to ask questions and make suggestions.  Earline Mayotte 06/08/2011, 6:56 PM

## 2011-06-09 MED ORDER — PANTOPRAZOLE SODIUM 40 MG PO TBEC
40.0000 mg | DELAYED_RELEASE_TABLET | Freq: Every day | ORAL | Status: DC
  Administered 2011-06-09 – 2011-06-14 (×6): 40 mg via ORAL
  Filled 2011-06-09 (×8): qty 1

## 2011-06-09 MED ORDER — HYDROXYZINE HCL 50 MG PO TABS
50.0000 mg | ORAL_TABLET | Freq: Four times a day (QID) | ORAL | Status: DC | PRN
  Administered 2011-06-09 – 2011-06-14 (×9): 50 mg via ORAL

## 2011-06-09 MED ORDER — HYDROXYZINE HCL 50 MG PO TABS
50.0000 mg | ORAL_TABLET | Freq: Every evening | ORAL | Status: DC | PRN
  Administered 2011-06-10 – 2011-06-11 (×3): 50 mg via ORAL
  Filled 2011-06-09: qty 6
  Filled 2011-06-09: qty 1

## 2011-06-09 MED ORDER — PROPRANOLOL HCL 20 MG PO TABS
20.0000 mg | ORAL_TABLET | Freq: Two times a day (BID) | ORAL | Status: DC
  Administered 2011-06-09 – 2011-06-14 (×11): 20 mg via ORAL
  Filled 2011-06-09: qty 1
  Filled 2011-06-09: qty 6
  Filled 2011-06-09 (×9): qty 1
  Filled 2011-06-09: qty 6
  Filled 2011-06-09 (×2): qty 1

## 2011-06-09 MED ORDER — HYDROXYZINE HCL 25 MG PO TABS
25.0000 mg | ORAL_TABLET | Freq: Four times a day (QID) | ORAL | Status: DC | PRN
  Administered 2011-06-09: 25 mg via ORAL

## 2011-06-09 MED ORDER — HYDROXYZINE HCL 25 MG PO TABS
25.0000 mg | ORAL_TABLET | Freq: Three times a day (TID) | ORAL | Status: DC
  Administered 2011-06-09: 25 mg via ORAL
  Filled 2011-06-09 (×6): qty 1

## 2011-06-09 MED ORDER — IBUPROFEN 600 MG PO TABS
ORAL_TABLET | ORAL | Status: AC
  Filled 2011-06-09: qty 1

## 2011-06-09 MED ORDER — IBUPROFEN 600 MG PO TABS
600.0000 mg | ORAL_TABLET | Freq: Four times a day (QID) | ORAL | Status: DC | PRN
  Administered 2011-06-09 – 2011-06-14 (×5): 600 mg via ORAL
  Filled 2011-06-09 (×2): qty 1
  Filled 2011-06-09: qty 3

## 2011-06-09 NOTE — Progress Notes (Signed)
B[facility] Group Notes:  (Counselor/Nursing/MHT/Case Management/Adjunct)  06/09/2011 12:06 PM  Type of Therapy:  Group Therapy  Participation Level:  Did Not Attend    Luanna Cole 06/09/2011, 12:06 PM

## 2011-06-09 NOTE — Progress Notes (Signed)
Patient seen during after care group.  She reports admitting to the hospital with anxiety and depression both rated at nine today.  Patient shared that her grandmother kicked her out of the home a few days ago and she will be living at the shelter.  She also endorses really bad panic attacks.  He is followed outpatient by Dr. Omelia Blackwater.  Patient denies ETOH and other drug abuse.

## 2011-06-09 NOTE — BHH Suicide Risk Assessment (Signed)
Suicide Risk Assessment  Admission Assessment     Demographic factors:    Current Mental Status:    Loss Factors:    Historical Factors:    Risk Reduction Factors:     CLINICAL FACTORS:   Severe Anxiety and/or Agitation Panic Attacks Bipolar Disorder:   Bipolar II Previous Psychiatric Diagnoses and Treatments  COGNITIVE FEATURES THAT CONTRIBUTE TO RISK:  Thought constriction (tunnel vision)    SUICIDE RISK:   Moderate:  Frequent suicidal ideation with limited intensity, and duration, some specificity in terms of plans, no associated intent, good self-control, limited dysphoria/symptomatology, some risk factors present, and identifiable protective factors, including available and accessible social support.  Reason for hospitalization: .Panic attack with SOA  Diagnosis:  Axis I: Anxiety Disorder NOS and Bipolar, mixed  ADL's:  Intact  Sleep: Poor  Appetite:  Fair  Suicidal Ideation:  Pt denies any suicidal thoughts. Homicidal Ideation:  Denies adamantly any homicidal thoughts.  Mental Status Examination/Evaluation: Objective:  Appearance: Casual  Eye Contact::  Good  Speech:  Clear and Coherent  Volume:  Normal  Mood:  8 /10 on a scale of 1 is the best and 10 is the worst  Anxiety: 10/10 on the same scale  Affect:  Flat  Thought Process:  Coherent  Orientation:  Full  Thought Content:  WDL  Suicidal Thoughts:  No  Homicidal Thoughts:  No  Memory:  Immediate;   Good  Judgement:  Fair  Insight:  Fair  Psychomotor Activity:  Normal  Concentration:  Fair  Recall:  Fair  Akathisia:  No  Handed:  Right  AIMS (if indicated):     Assets:  Communication Skills Desire for Improvement Financial Resources/Insurance Housing Leisure Time Physical Health Social Support  Sleep:  Number of Hours: 6.75     Vital Signs: Blood pressure 115/78, pulse 76, temperature 98.1 F (36.7 C), temperature source Oral, resp. rate 18, height 5\' 7"  (1.702 m), weight 73.029 kg (161  lb), last menstrual period 05/21/2011. Current Medications:  Current Facility-Administered Medications  Medication Dose Route Frequency Provider Last Rate Last Dose  . acetaminophen (TYLENOL) tablet 650 mg  650 mg Oral Q6H PRN Ronny Bacon, MD   650 mg at 06/09/11 1218  . alum & mag hydroxide-simeth (MAALOX/MYLANTA) 200-200-20 MG/5ML suspension 30 mL  30 mL Oral Q4H PRN Curlene Labrum Readling, MD      . hydrOXYzine (ATARAX/VISTARIL) tablet 50 mg  50 mg Oral QID PRN Mike Craze, MD      . hydrOXYzine (ATARAX/VISTARIL) tablet 50 mg  50 mg Oral QHS PRN,MR X 1 Mike Craze, MD      . magnesium hydroxide (MILK OF MAGNESIA) suspension 30 mL  30 mL Oral Daily PRN Curlene Labrum Readling, MD      . pantoprazole (PROTONIX) EC tablet 40 mg  40 mg Oral Daily Mike Craze, MD      . propranolol (INDERAL) tablet 20 mg  20 mg Oral BID Mike Craze, MD      . traZODone (DESYREL) tablet 50 mg  50 mg Oral QHS PRN,MR X 1 Mickie D. Adams, PA      . DISCONTD: hydrOXYzine (ATARAX/VISTARIL) tablet 25 mg  25 mg Oral TID Mike Craze, MD   25 mg at 06/09/11 1218  . DISCONTD: hydrOXYzine (ATARAX/VISTARIL) tablet 25 mg  25 mg Oral QID PRN Mike Craze, MD   25 mg at 06/09/11 1254  . DISCONTD: traZODone (DESYREL) tablet 50 mg  50 mg  Oral QHS PRN Ronny Bacon, MD       Facility-Administered Medications Ordered in Other Encounters  Medication Dose Route Frequency Provider Last Rate Last Dose  . DISCONTD: acetaminophen (TYLENOL) tablet 650 mg  650 mg Oral Q4H PRN Rodena Medin, PA-C      . DISCONTD: benztropine (COGENTIN) tablet 1 mg  1 mg Oral BID Rodena Medin, PA-C   1 mg at 06/08/11 1005  . DISCONTD: haloperidol (HALDOL) tablet 5-10 mg  5-10 mg Oral BID Rodena Medin, PA-C   5 mg at 06/08/11 1005  . DISCONTD: LORazepam (ATIVAN) tablet 1 mg  1 mg Oral Q8H PRN Rodena Medin, PA-C   1 mg at 06/08/11 1203  . DISCONTD: lurasidone (LATUDA) tablet 40 mg  40 mg Oral Q breakfast Rodena Medin, PA-C   40 mg at  06/08/11 0850  . DISCONTD: pantoprazole (PROTONIX) EC tablet 40 mg  40 mg Oral Daily Rodena Medin, PA-C   40 mg at 06/08/11 1005  . DISCONTD: propranolol (INDERAL) tablet 20 mg  20 mg Oral Q12H Rodena Medin, PA-C   20 mg at 06/08/11 1610  . DISCONTD: zolpidem (AMBIEN) tablet 5 mg  5 mg Oral QHS PRN Rodena Medin, PA-C        Lab Results:  Results for orders placed during the hospital encounter of 06/07/11 (from the past 48 hour(s))  POCT PREGNANCY, URINE     Status: Normal   Collection Time   06/07/11  3:41 PM      Component Value Range Comment   Preg Test, Ur NEGATIVE  NEGATIVE    CBC     Status: Normal   Collection Time   06/07/11  4:13 PM      Component Value Range Comment   WBC 10.0  4.0 - 10.5 (K/uL)    RBC 3.90  3.87 - 5.11 (MIL/uL)    Hemoglobin 12.1  12.0 - 15.0 (g/dL)    HCT 96.0  45.4 - 09.8 (%)    MCV 94.6  78.0 - 100.0 (fL)    MCH 31.0  26.0 - 34.0 (pg)    MCHC 32.8  30.0 - 36.0 (g/dL)    RDW 11.9  14.7 - 82.9 (%)    Platelets 232  150 - 400 (K/uL)   ETHANOL     Status: Normal   Collection Time   06/07/11  4:13 PM      Component Value Range Comment   Alcohol, Ethyl (B) <11  0 - 11 (mg/dL)   POCT I-STAT, CHEM 8     Status: Abnormal   Collection Time   06/07/11  4:32 PM      Component Value Range Comment   Sodium 142  135 - 145 (mEq/L)    Potassium 3.6  3.5 - 5.1 (mEq/L)    Chloride 102  96 - 112 (mEq/L)    BUN 12  6 - 23 (mg/dL)    Creatinine, Ser 5.62  0.50 - 1.10 (mg/dL)    Glucose, Bld 130 (*) 70 - 99 (mg/dL)    Calcium, Ion 8.65  1.12 - 1.32 (mmol/L)    TCO2 28  0 - 100 (mmol/L)    Hemoglobin 13.6  12.0 - 15.0 (g/dL)    HCT 78.4  69.6 - 29.5 (%)    Physical Findings: AIMS:   CIWA:   CIWA-Ar Total: 1  COWS:      Treatment Plan Summary: Daily contact with patient to assess and  evaluate symptoms and progress in treatment Medication management  Risk of harm to self is elevated by her diagnosis of bipolar disorder and anxiety disorder  Risk of  harm to others is minimal in that she has not been involved in fights or had any legal charges filed on her.  Plan: We will admit the patient for crisis stabilization and treatment. I talked to pt about starting Vistaril at higher dose than earlier.  I explained the risks and benefits of medication in detail.  We will continue on q. 15 checks the unit protocol. At this time there is no clinical indication for one-to-one observation as patient contract for safety and presents little risk to harm themself and others.  We will increase collateral information. I encourage patient to participate in group milieu therapy. Pt will be seen in treatment team meeting tomorrow morning for further treatment and appropriate discharge planning. Please see history and physical note for more detailed information ELOS: 3 to 5 days.   Mattea Seger 06/09/2011, 3:40 PM

## 2011-06-09 NOTE — Progress Notes (Signed)
Patient anxious this AM. Rates Depression 9 and hopelessness 5. Reports energy low, and attentiveness good. Appetite is improving and sleep poor. Mood is depressed and affect flat. Patient focused on where she will be living at discharge. Reports being homeless for three days. Encouraged and supported.

## 2011-06-09 NOTE — Tx Team (Signed)
Interdisciplinary Treatment Plan Update (Adult)  Date:  06/09/2011  Time Reviewed:  10:24 AM   Progress in Treatment: Attending groups: Yes Participating in groups:  Yes, open and sharing in group Taking medication as prescribed:  Yes Tolerating medication: Yes Family/Significant othe contact made:  No, requesting consent to contact family Patient understands diagnosis: Yes Discussing patient identified problems/goals with staff:  Yes Medical problems stabilized or resolved: Yes Denies suicidal/homicidal ideation: Yes Issues/concerns per patient self-inventory:  No  Other:  New problem(s) identified: None  Reason for Continuation of Hospitalization: Anxiety Medication stabilization  Interventions implemented related to continuation of hospitalization:  Medication stabilization, safety checks q 15 mins, group attendance  Additional comments:  Estimated length of stay: 3-5 days  Discharge Plan: [name] will discharge and follow up with Dr. Omelia Blackwater, appointment with therapist will be made  New goal(s):  Review of initial/current patient goals per problem list:   1.  Goal(s): Reduce potential for self-harm  Met:  Yes  Target date: by discharge  As evidenced by: [name] denies suicidal   2.  Goal (s): Decrease depressive symptoms  Met:  No  Target date: by discharge   As evidenced by: [name] will report depression at lower than 5 (rates at 9 today)  3.  Goal(s): Anxiety   Met:  No  Target date: by discharge   As evidenced by: [name] will rate anixety at lower than 5 (rates at 9 today)   Attendees: Patient:  [name] Whitney 06/09/2011 10:24 AM  Family:     Physician:  Dr Orson Aloe, MD 06/09/2011 10:24 AM  Nursing:   Barrie Folk, RN 06/09/2011 10:24 AM  Case Manager:  Juline Patch, LCSW 06/09/2011 10:24 AM  Counselor:  Angus Palms, LCSW 06/09/2011 10:24 AM  Other:  Reyes Ivan, LCSWA 06/09/2011 10:24 AM  Other:  Charlyne Mom, doctoral psych intern  06/09/2011 10:24 AM  Other:     Other:      Scribe for Treatment Team:   Billie Lade, 06/09/2011 10:24 AM

## 2011-06-09 NOTE — Progress Notes (Signed)
[facility] Group Notes: (Counselor/Nursing/MHT/Case Management/Adjunct)  06/09/2011 @1 :15pm  Type of Therapy: Group Therapy   Participation Level: Limited   Participation Quality: Attentive   Affect: Depressed/Flat   Cognitive: Appropriate   Insight: None   Engagement in Group: Limited   Engagement in Therapy: None   Modes of Intervention: Support and Exploration   Summary of Progress/Problems: Cambri participated in the lovingkindness/wellness meditation and circle breathing exercise. She was very quiet and did not process her experience afterward, though she did nod along and seem to agree with statements of others about not liking self and not thinking she deserves happiness or peace.

## 2011-06-09 NOTE — Progress Notes (Signed)
Writer spoke with Reita Cliche at [facility] to notify of patient being admitted to the hospital.  Reita Cliche stated they would notify [employer].

## 2011-06-09 NOTE — Progress Notes (Signed)
Writer called and left message for [employer] LCSW letting her know of order for discharge summaries needed from [facility].

## 2011-06-09 NOTE — Progress Notes (Signed)
Pt has been in bed this night shift with eyes closed and respirations even and unlabored.  Continue 15' checks for safety.

## 2011-06-10 DIAGNOSIS — F39 Unspecified mood [affective] disorder: Secondary | ICD-10-CM

## 2011-06-10 MED ORDER — CHLORPROMAZINE HCL 10 MG PO TABS
5.0000 mg | ORAL_TABLET | Freq: Three times a day (TID) | ORAL | Status: DC
  Administered 2011-06-10 – 2011-06-11 (×4): 5 mg via ORAL
  Filled 2011-06-10 (×8): qty 1

## 2011-06-10 MED ORDER — NICOTINE 14 MG/24HR TD PT24
MEDICATED_PATCH | TRANSDERMAL | Status: AC
  Administered 2011-06-10 – 2011-06-11 (×2): 14 mg via TRANSDERMAL
  Filled 2011-06-10: qty 1

## 2011-06-10 MED ORDER — BENZTROPINE MESYLATE 1 MG PO TABS
1.0000 mg | ORAL_TABLET | Freq: Two times a day (BID) | ORAL | Status: DC | PRN
Start: 2011-06-10 — End: 2011-06-14
  Filled 2011-06-10: qty 6
  Filled 2011-06-10: qty 1

## 2011-06-10 MED ORDER — NICOTINE 14 MG/24HR TD PT24
14.0000 mg | MEDICATED_PATCH | Freq: Every day | TRANSDERMAL | Status: DC
  Administered 2011-06-10 – 2011-06-14 (×5): 14 mg via TRANSDERMAL
  Filled 2011-06-10 (×6): qty 1

## 2011-06-10 MED ORDER — CHLORPROMAZINE HCL 10 MG PO TABS
5.0000 mg | ORAL_TABLET | ORAL | Status: AC
  Administered 2011-06-10: 5 mg via ORAL
  Filled 2011-06-10: qty 1

## 2011-06-10 MED ORDER — CHLORPROMAZINE HCL 10 MG PO TABS
5.0000 mg | ORAL_TABLET | Freq: Three times a day (TID) | ORAL | Status: DC
  Filled 2011-06-10 (×3): qty 1

## 2011-06-10 NOTE — Progress Notes (Signed)
Patient seen during after care group.  She advised of having mixed emotions of being happy and sad.  She also endorses having panic attacks.  Patient rates depression at eight and anxiety, hopelessness/helplessness at five.  Patient stated her major stressor is from not being able to return to her parent's home with her son as a major stressor.  She was informed that Clinical research associate spoke with Reita Cliche at [facility] to notify of hospitalization.

## 2011-06-10 NOTE — Progress Notes (Signed)
[facility] MD Progress Note  06/10/2011 11:45 PM  Diagnosis:  Axis I: Anxiety Disorder NOS  ADL's:  Intact  Sleep: Poor  Appetite:  Fair  Suicidal Ideation:  Pt describes no SI today Homicidal Ideation:  Denies adamantly any homicidal thoughts.  Mental Status Examination/Evaluation: Objective:  Appearance: Casual  Eye Contact::  Fair  Speech:  Clear and Coherent  Volume:  Normal  Mood:  8 /10 on a scale of 1 is the best and 10 is the worst  Anxiety: 5/10 on the same scale  Affect:  Congruent  Thought Process:  Coherent  Orientation:  Full  Thought Content:  WDL  Suicidal Thoughts:  No  Homicidal Thoughts:  No  Memory:  Immediate;   Good  Judgement:  Fair  Insight:  Fair  Psychomotor Activity:  Normal  Concentration:  Fair  Recall:  Fair  Akathisia:  No  AIMS (if indicated):     Assets:  Communication Skills Desire for Improvement Financial Resources/Insurance Housing Physical Health Social Support  Sleep:  Number of Hours: 6.5    Vital Signs:Blood pressure 138/93, pulse 68, temperature 97.6 F (36.4 C), temperature source Oral, resp. rate 16, height 5\' 7"  (1.702 m), weight 73.029 kg (161 lb), last menstrual period 05/21/2011. Current Medications: Current Facility-Administered Medications  Medication Dose Route Frequency Provider Last Rate Last Dose  . acetaminophen (TYLENOL) tablet 650 mg  650 mg Oral Q6H PRN Ronny Bacon, MD   650 mg at 06/10/11 1838  . alum & mag hydroxide-simeth (MAALOX/MYLANTA) 200-200-20 MG/5ML suspension 30 mL  30 mL Oral Q4H PRN Curlene Labrum Readling, MD      . benztropine (COGENTIN) tablet 1 mg  1 mg Oral BID PRN Mike Craze, MD      . chlorproMAZINE (THORAZINE) tablet 5 mg  5 mg Oral NOW Mike Craze, MD   5 mg at 06/10/11 1159  . chlorproMAZINE (THORAZINE) tablet 5 mg  5 mg Oral TID Mike Craze, MD   5 mg at 06/10/11 2129  . hydrOXYzine (ATARAX/VISTARIL) tablet 50 mg  50 mg Oral QID PRN Mike Craze, MD   50 mg at 06/10/11 1606  .  hydrOXYzine (ATARAX/VISTARIL) tablet 50 mg  50 mg Oral QHS PRN,MR X 1 Mike Craze, MD   50 mg at 06/10/11 2313  . ibuprofen (ADVIL,MOTRIN) 600 MG tablet           . ibuprofen (ADVIL,MOTRIN) tablet 600 mg  600 mg Oral Q6H PRN Mike Craze, MD   600 mg at 06/09/11 1652  . magnesium hydroxide (MILK OF MAGNESIA) suspension 30 mL  30 mL Oral Daily PRN Curlene Labrum Readling, MD      . nicotine (NICODERM CQ - dosed in mg/24 hours) 14 mg/24hr patch        14 mg at 06/10/11 0908  . nicotine (NICODERM CQ - dosed in mg/24 hours) patch 14 mg  14 mg Transdermal Daily Mike Craze, MD      . pantoprazole (PROTONIX) EC tablet 40 mg  40 mg Oral Daily Mike Craze, MD   40 mg at 06/10/11 1610  . propranolol (INDERAL) tablet 20 mg  20 mg Oral BID Mike Craze, MD   20 mg at 06/10/11 1606  . traZODone (DESYREL) tablet 50 mg  50 mg Oral QHS PRN,MR X 1 Mickie D. Adams, PA      . DISCONTD: chlorproMAZINE (THORAZINE) tablet 5 mg  5 mg Oral TID Mike Craze, MD  Lab Results: No results found for this or any previous visit (from the past 48 hour(s)).  Physical Findings: AIMS:  , ,  ,  ,    CIWA:  CIWA-Ar Total: 1  COWS:  COWS Total Score: 1   Treatment Plan Summary: Daily contact with patient to assess and evaluate symptoms and progress in treatment Medication management  Plan: Pt had noted very little benefit from the Vistaril.  She was upset by another patient acting loudly and disruptive on the unit this AM.  She appeared observably shaken and distraught.  I ordered a 25 mg Thorazine for her, but she has had a reaction of muscle cramping to Compazine.  So I lowered the dose to 5 mg.  She took that and noted no reaction.  She was willing to try a higher dose, so another 5 mg was given now and recurring doses of 5 mg were ordered.  Will observe for any muscle reactions and if none, consider pushing Thorazine to the lowest effective dose. [name] Whitney 06/10/2011, 11:45 PM

## 2011-06-10 NOTE — Progress Notes (Signed)
[facility] Group Notes: (Counselor/Nursing/MHT/Case Management/Adjunct) 06/10/2011   @  11:00am   Type of Therapy:  Group Therapy  Participation Level:  Did Not Attend    [name] Whitney 06/10/2011 11:44am     [facility] Group Notes: (Counselor/Nursing/MHT/Case Management/Adjunct) 06/10/2011   @  1:15pm   Type of Therapy:  Group Therapy  Participation Level:  Did Not Attend    [name] Whitney 06/10/2011 3:54 PM

## 2011-06-10 NOTE — Progress Notes (Addendum)
Patient's self inventory sheet, she needs sleeping meds, improving appetite, low energy level, good attention span.  Rated depression and hopelessness #5.  Denied SI.  Stated she has had headache in past 24 hours.  Pain goal #5, worst pain #6.  After discharge, plans to take her meds correctly.  No questions for staff.  No problems taking meds after discharge. Per MD order, patient was given thorazine 5 mg this morning, and then was given another thorazine 5 mg, all per MD order.  Patient later stated she was feeling much better and believes thorazine is helping her.  Stated she believes her uncles are trying to find place for her to live after her discharge from [facility].   Stated she appreciates all the doctor has done to help her with her medications.
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
## 2011-06-10 NOTE — Progress Notes (Signed)
Interdisciplinary Treatment Plan Update (Adult)  Date:  06/10/2011  Time Reviewed:  10:33 AM   Progress in Treatment: Attending groups:   Yes   Participating in groups:  Yes Taking medication as prescribed:  Yes Tolerating medication:  Yes Family/Significant othe contact made:  Patient understands diagnosis:  Yes Discussing patient identified problems/goals with staff: Yes Medical problems stabilized or resolved: Yes Denies suicidal/homicidal ideation:Yes Issues/concerns per patient self-inventory:  Other:  New problem(s) identified:  Reason for Continuation of Hospitalization: Anxiety Depression Medication stabilization  Interventions implemented related to continuation of hospitalization:  Medication Management; safety checks q 15 mins  Additional comments:  Estimated length of stay:  Discharge Plan:  New goal(s):  Review of initial/current patient goals per problem list:    1.  Goal(s):   Eliminate SI  Met:  Yes  Target date: d/c  As evidenced by: [name] Whitney is currently denying SI  2.  Goal (s): Reduce depression (currently rated at eight)  Met:  No  Target date: d/c  As evidenced by: Patient will rate depression at four or below on discharge  3.  Goal(s):  Stabilize on medications  Met:  No  Target date: d/c  As evidenced by:  Patient will report medications are working - less symptomatic   Attendees: Patient:     Family:     Physician:  Orson Aloe, MD 06/10/2011 10:33 AM   Nursing:    06/10/2011 10:33 AM   CaseManager:  [name] Patch, LCSW 06/10/2011 10:33 AM   Counselor:  [name] Palms, LCSW 06/10/2011 10:33 AM   Other:  [name] Bossier, NP 06/10/2011 10:33 AM   Other:  [name] Whitney, LCSWA 06/10/2011  10:33 AM   Other:     Other:      Scribe for Treatment Team:   [name] Banker, LCSW,  06/10/2011 10:33 AM

## 2011-06-10 NOTE — Progress Notes (Signed)
Writer observed patient lying in bed resting, appeared to be asleep but was easily aroused when her name was called. Patient reports that she has had a pretty hard day especially with her depression and anxiety. Patient reports that after her Dr. ordered her thorazine it has seemed to help. Patient was supported and encouraged and informed of her scheduled hs medications and prns available.Patient currently denies pain, -si/hi/a/v hall. Safety maintained on unit.

## 2011-06-10 NOTE — Progress Notes (Signed)
Patient in bed , eyes closed respiration even and unlabored.. Will continue to monitor

## 2011-06-11 MED ORDER — CHLORPROMAZINE HCL 10 MG PO TABS
10.0000 mg | ORAL_TABLET | Freq: Three times a day (TID) | ORAL | Status: DC
  Administered 2011-06-11 – 2011-06-14 (×9): 10 mg via ORAL
  Filled 2011-06-11: qty 9
  Filled 2011-06-11: qty 1
  Filled 2011-06-11: qty 9
  Filled 2011-06-11 (×6): qty 1
  Filled 2011-06-11: qty 9
  Filled 2011-06-11 (×4): qty 1

## 2011-06-11 MED ORDER — PRAZOSIN HCL 1 MG PO CAPS
1.0000 mg | ORAL_CAPSULE | Freq: Every evening | ORAL | Status: DC | PRN
  Administered 2011-06-11 – 2011-06-14 (×4): 1 mg via ORAL
  Filled 2011-06-11 (×11): qty 1

## 2011-06-11 NOTE — Progress Notes (Signed)
[facility] MD Progress Note  06/11/2011 4:16 PM  Diagnosis:  Axis I: Anxiety Disorder NOS  ADL's:  Intact  Sleep: Poor, wakes up at 3:45 every AM feeling really scared and having a panic attack in the morning.  Appetite:  Good  Suicidal Ideation:  Pt describes no SI today Homicidal Ideation:  Denies adamantly any homicidal thoughts.  Mental Status Examination/Evaluation: Objective:  Appearance: Casual  Eye Contact::  Fair  Speech:  Clear and Coherent  Volume:  Normal  Mood:  5 /10 on a scale of 1 is the best and 10 is the worst  Anxiety: 5/10 on the same scale  Affect:  Congruent  Thought Process:  Coherent  Orientation:  Full  Thought Content:  WDL  Suicidal Thoughts:  No  Homicidal Thoughts:  No  Memory:  Immediate;   Good  Judgement:  Good  Insight:  Good  Psychomotor Activity:  Normal  Concentration:  Fair  Recall:  Fair  Akathisia:  No  AIMS (if indicated):     Assets:  Communication Skills Desire for Improvement Financial Resources/Insurance Housing Physical Health Social Support  Sleep:  Number of Hours: 5    Vital Signs:Blood pressure 124/86, pulse 77, temperature 98 F (36.7 C), temperature source Oral, resp. rate 16, height 5\' 7"  (1.702 m), weight 73.029 kg (161 lb), last menstrual period 05/21/2011. Current Medications: Current Facility-Administered Medications  Medication Dose Route Frequency Provider Last Rate Last Dose  . acetaminophen (TYLENOL) tablet 650 mg  650 mg Oral Q6H PRN Ronny Bacon, MD   650 mg at 06/11/11 0524  . alum & mag hydroxide-simeth (MAALOX/MYLANTA) 200-200-20 MG/5ML suspension 30 mL  30 mL Oral Q4H PRN Curlene Labrum Readling, MD      . benztropine (COGENTIN) tablet 1 mg  1 mg Oral BID PRN Mike Craze, MD      . chlorproMAZINE (THORAZINE) tablet 10 mg  10 mg Oral TID Mike Craze, MD      . hydrOXYzine (ATARAX/VISTARIL) tablet 50 mg  50 mg Oral QID PRN Mike Craze, MD   50 mg at 06/11/11 1256  . hydrOXYzine (ATARAX/VISTARIL)  tablet 50 mg  50 mg Oral QHS PRN,MR X 1 Mike Craze, MD   50 mg at 06/10/11 2313  . ibuprofen (ADVIL,MOTRIN) tablet 600 mg  600 mg Oral Q6H PRN Mike Craze, MD   600 mg at 06/11/11 0626  . magnesium hydroxide (MILK OF MAGNESIA) suspension 30 mL  30 mL Oral Daily PRN Curlene Labrum Readling, MD      . nicotine (NICODERM CQ - dosed in mg/24 hours) patch 14 mg  14 mg Transdermal Daily Mike Craze, MD   14 mg at 06/11/11 0530  . pantoprazole (PROTONIX) EC tablet 40 mg  40 mg Oral Daily Mike Craze, MD   40 mg at 06/11/11 0836  . prazosin (MINIPRESS) capsule 1 mg  1 mg Oral QHS,MR X 1 Mike Craze, MD      . propranolol (INDERAL) tablet 20 mg  20 mg Oral BID Mike Craze, MD   20 mg at 06/11/11 0836  . DISCONTD: chlorproMAZINE (THORAZINE) tablet 5 mg  5 mg Oral TID Mike Craze, MD   5 mg at 06/11/11 1456  . DISCONTD: traZODone (DESYREL) tablet 50 mg  50 mg Oral QHS PRN,MR X 1 Mickie D. Adams, PA        Lab Results: No results found for this or any previous visit (from the past  48 hour(s)).  Physical Findings: AIMS:  , ,  ,  ,    CIWA:  CIWA-Ar Total: 1  COWS:  COWS Total Score: 1   Treatment Plan Summary: Daily contact with patient to assess and evaluate symptoms and progress in treatment Medication management  Plan: Pt noted that her BP generally runs high.  She notes crazy dreams and incomplete benefit from Trazodone 50 mg with repeat.  She is agreeable to trying Minipress for getting to sleep, staying asleep and to suppress nightmares. She has noted no muscle cramps with the Thorazine and appreciates a benefit with her anxiety calming down some.  She wishes to have the dose increased.  She agrees to tell the staff if she has any muscle cramping with the 10 mg dose.  She was informed that Cogentin is ordered for her if she dose have any such muscle cramping.  She was informed that she could increase the Thorazine to QID if she needs to. She takes Invega by shots for bipolar  disorder.  She desires to stop that.   She reports that she had been on Lithium and her levels were good, but she is not clear why she was stopped from that.  The ultimate plan for her bipolar management will need to be decided before she leaves. Abdirizak Richison 06/11/2011, 4:16 PM

## 2011-06-11 NOTE — Progress Notes (Signed)
06/11/2011         Time: 1415      Group Topic/Focus: The focus of this group is on enhancing the patient's understanding of leisure, barriers to leisure, and the importance of engaging in positive leisure activities upon discharge for improved total health.  Participation Level: Active  Participation Quality: Appropriate and Attentive  Affect: Appropriate  Cognitive: Oriented   Additional Comments: Patient talking about leisure activities she enjoys with her son.   [name] Whitney 06/11/2011 3:55 PM

## 2011-06-11 NOTE — Progress Notes (Signed)
Pt. Having a hard time today. Having a lot of anxiety today and requesting medication for it. Worried about not having a place to live, but happy that her child is with her parents. Not able to live with them because her brother will not pay the rent if she lives there. Also was asking to go to a group home so she won't have to stay in a shelter. Given support, and reassurance. Denies SI and HI.

## 2011-06-11 NOTE — Progress Notes (Signed)
Patient seen during discharge planning group.  She reports being a little better and denies SI.  Patient rates depression and anxiety at eight.  Writer reminded patient of scheduled Invega injection on 06/15/10 at [facility].  MD also advised.  Patient stated that she does not like taking the medication due to side effects.  Patient is hopeful to discharge soon.

## 2011-06-11 NOTE — Progress Notes (Signed)
[facility] Group Notes: (Counselor/Nursing/MHT/Case Management/Adjunct) 06/11/2011   @  11:00am   Type of Therapy:  Group Therapy  Participation Level:  None  Participation Quality: Attentive  Affect:  Depressed/Flat  Cognitive:  Appropriate  Insight:  None  Engagement in Group: None   Engagement in Therapy:  None  Modes of Intervention:  Support and Exploration  Summary of Progress/Problems:  [name] Whitney  was attentive but not engaged in group process    [name] Whitney 06/11/2011 1:04 PM

## 2011-06-11 NOTE — Progress Notes (Signed)
Patient ID: [name] Whitney, female   DOB: 16-Oct-1985, 26 y.o.   MRN: [id number] Pt. denies lethality and A/V/H's or other than anxiety: Pt. is expecting her "father to come to the hospital to sign some papers for (her) son".  Pt. Refused to go to supper, because she heard they are serving chicken and "I'm tired of it". 17:55 Pt. changed her mind and decided to go to get a sandwich or something else, when encouraged. 22:00 Pt. remains calm and quiet.

## 2011-06-11 NOTE — Progress Notes (Signed)
[facility] Adult Inpatient Family/Significant Other Suicide Prevention Education  Suicide Prevention Education:  Education Completed; Marita Burnsed, mother, [phone]) has been identified by the patient as the family member/significant who will aid the patient in the event of a mental health crisis (suicidal ideations/suicide attempt).  With written consent from the patient, the family member/significant other has been provided the following suicide prevention education, prior to the and/or following the discharge of the patient.  The suicide prevention education provided includes the following:  Suicide risk factors  Suicide prevention and interventions  National Suicide Hotline telephone number  [facility] assessment telephone number  [facility] Emergency Assistance 911  [facility] and/or Residential Mobile Crisis Unit telephone number  Request made of family/significant other to:  Remove weapons (e.g., guns, rifles, knives), all items previously/currently identified as safety concern.    Remove drugs/medications (over-the-counter, prescriptions, illicit drugs), all items previously/currently identified as a safety concern.  Melissa reported that she has never known Freida to intentionally try to harm herself, but that she has a problem with abusing Xanax and other drugs. She stated that she cannot make the decision whether or not Estela can go to their home, but that she does not think Chrystle's father will allow her back. Melissa shared that prior to being hospitalized Ia was with her grandmother for 2 weeks, and before that she had taken her son to grandmother's house for 6 months in which time Arnitra did not allow her parents contact with her son. A notarized letter from Klani gives her parents permission to have physical custody of Tonyia's son and to make decisions for him. Melissa verbalized understanding of suicide prevention and had no further questions. She denied  that Kimberlin has any access to weapons.   Billie Lade 06/11/2011, 2:39 PM

## 2011-06-12 DIAGNOSIS — F319 Bipolar disorder, unspecified: Secondary | ICD-10-CM

## 2011-06-12 DIAGNOSIS — F410 Panic disorder [episodic paroxysmal anxiety] without agoraphobia: Principal | ICD-10-CM

## 2011-06-12 NOTE — Progress Notes (Signed)
Lying quietly in bed with eyes closed.  Routine safety checks are being conduced Q 15 minutes.

## 2011-06-12 NOTE — Progress Notes (Signed)
Pt has been attending the groups and interacting with select peers. Pt writes on her Pt. Self inventory that she is the same as yesterday. Rates her appetite and and attention as improving. Rates her depression as a 6 and her feelings of hopelessness and helplessness as a 1.  Has complained and been medicated for a headache today. Given support reassurance and praise.

## 2011-06-12 NOTE — Progress Notes (Signed)
Pt reports her day was "okay" and complained of anxiety at 2010. Medicated with prn vistaril with some success. Pt is very flat in affect, mood consistent with her report. She did attend evening group and is up and around the milieu. Medicated per orders at hs, support given. She denies SI/HI and is currently asleep in bed. [name]

## 2011-06-12 NOTE — Progress Notes (Signed)
[facility] Group Notes:  (Counselor/Nursing/MHT/Case Management/Adjunct)  06/12/2011 9:47 AM  Type of Therapy:  After Care Planning Group  Pt. did not attend group session  today.  Lamar Blinks [place] 06/12/2011, 9:47 AM

## 2011-06-12 NOTE — Progress Notes (Signed)
[facility] MD Progress Note  06/12/2011 3:05 PM  Diagnosis:   Axis I: See current hospital problem list Axis II: Deferred Axis III:  Past Medical History  Diagnosis Date  . Hypertension   . Migraine headache   . Depression   . Irritable bowel syndrome (IBS)   . Bipolar 1 disorder   . Depression    Axis IV: Unchanged Axis V: 41-50 serious symptoms  ADL's:  Intact  Sleep: Good  Appetite:  Good  Suicidal Ideation:  None Homicidal Ideation:  none  Subjective: [name] Whitney reports that she is doing well today.  She states that the medication that she has been started on seems to work well. She presses some anxiety about wanting to be discharged so that she can get her clothes and belongings. She denies any auditory or visual hallucinations. She denies any suicidal or homicidal ideation.  AEB (as evidenced by):  Mental Status Examination/Evaluation: Objective:  Appearance: Disheveled  Eye Contact::  Fair  Speech:  Clear and Coherent  Volume:  Normal  Mood:  Anxious  Affect:  Blunt  Thought Process:  Circumstantial and Goal Directed  Orientation:  Full  Thought Content:  WDL  Suicidal Thoughts:  No  Homicidal Thoughts:  No  Memory:  Remote;   Good  Judgement:  Good  Insight:  Fair  Psychomotor Activity:  Normal  Concentration:  Good  Recall:  Good  Akathisia:  No  Handed:    AIMS (if indicated):     Assets:  Communication Skills Desire for Improvement  Sleep:  Number of Hours: 5    Vital Signs:Blood pressure 124/87, pulse 81, temperature 99.2 F (37.3 C), temperature source Oral, resp. rate 12, height 5\' 7"  (1.702 m), weight 73.029 kg (161 lb), last menstrual period 05/21/2011. Current Medications: Current Facility-Administered Medications  Medication Dose Route Frequency Provider Last Rate Last Dose  . acetaminophen (TYLENOL) tablet 650 mg  650 mg Oral Q6H PRN Ronny Bacon, MD   650 mg at 06/11/11 0524  . alum & mag hydroxide-simeth (MAALOX/MYLANTA) 200-200-20 MG/5ML  suspension 30 mL  30 mL Oral Q4H PRN Curlene Labrum Readling, MD      . benztropine (COGENTIN) tablet 1 mg  1 mg Oral BID PRN Mike Craze, MD      . chlorproMAZINE (THORAZINE) tablet 10 mg  10 mg Oral TID Mike Craze, MD   10 mg at 06/12/11 1441  . hydrOXYzine (ATARAX/VISTARIL) tablet 50 mg  50 mg Oral QID PRN Mike Craze, MD   50 mg at 06/11/11 1256  . hydrOXYzine (ATARAX/VISTARIL) tablet 50 mg  50 mg Oral QHS PRN,MR X 1 Mike Craze, MD   50 mg at 06/11/11 2355  . ibuprofen (ADVIL,MOTRIN) tablet 600 mg  600 mg Oral Q6H PRN Mike Craze, MD   600 mg at 06/12/11 1204  . magnesium hydroxide (MILK OF MAGNESIA) suspension 30 mL  30 mL Oral Daily PRN Curlene Labrum Readling, MD      . nicotine (NICODERM CQ - dosed in mg/24 hours) patch 14 mg  14 mg Transdermal Daily Mike Craze, MD   14 mg at 06/12/11 9604  . pantoprazole (PROTONIX) EC tablet 40 mg  40 mg Oral Daily Mike Craze, MD   40 mg at 06/12/11 5409  . prazosin (MINIPRESS) capsule 1 mg  1 mg Oral QHS,MR X 1 Mike Craze, MD   1 mg at 06/11/11 2212  . propranolol (INDERAL) tablet 20 mg  20 mg  Oral BID Mike Craze, MD   20 mg at 06/12/11 9147  . DISCONTD: chlorproMAZINE (THORAZINE) tablet 5 mg  5 mg Oral TID Mike Craze, MD   5 mg at 06/11/11 1456  . DISCONTD: traZODone (DESYREL) tablet 50 mg  50 mg Oral QHS PRN,MR X 1 Mickie D. Adams, PA        Lab Results: No results found for this or any previous visit (from the past 48 hour(s)).  Physical Findings: AIMS:  , ,  ,  ,    CIWA:  CIWA-Ar Total: 1  COWS:  COWS Total Score: 1   Treatment Plan Summary: Daily contact with patient to assess and evaluate symptoms and progress in treatment Medication management  Plan: We will continue her current plan of care and monitor her for change. A discharge plan should be solidified.She plans to return to the homeless shelter at discharge.  [name] Whitney 06/12/2011, 3:05 PM

## 2011-06-12 NOTE — Progress Notes (Signed)
[facility] Group Notes:  (Counselor/Nursing/MHT/Case Management/Adjunct)  06/12/2011 1315  Type of Therapy:  Group Therapy  Participation Level:  Active  Participation Quality:  Appropriate  Affect:  Appropriate  Cognitive:  Appropriate  Insight:  Good  Engagement in Group:  Good  Engagement in Therapy:  Good  Modes of Intervention:  Activity, Clarification and Problem-solving  Summary of Progress/Problems: Pt. attended and participated in group session on self-sabotage. Pt was asked to identify how their sabotaging thoughts and behaviors influence their goals. Pt was encouraged to explore ways in which they can reframe their destructive beliefs into constructive beliefs.  Pt stated that her self-sabotage belief is that she assumes what others think of her, and that she puts her self down and allows other people's emotions to influence her. Pt stated that her first step to breaking free from self-sabotage is coming to [facility], and using the [employer] handout will help her get through her issues.   Mariella Blackwelder 06/12/2011, 3:36 PM

## 2011-06-13 NOTE — Progress Notes (Signed)
Met with pt 1:1 who reports feeling better today despite increased need for sleep this morning and reports of anxiety this afternoon. Affect is bizarre, fixed and somewhat incongruent. No prns requested this evening. Pt med compliant and is presently asleep. Second dose of minipress not needed tonight. She denies SI/HI and is safe. [name]

## 2011-06-13 NOTE — Progress Notes (Signed)
[facility] MD Progress Note  06/13/2011 1:59 PM  Diagnosis:   Axis I: See current hospital problem list Axis II: Deferred Axis III:  Past Medical History  Diagnosis Date  . Hypertension   . Migraine headache   . Depression   . Irritable bowel syndrome (IBS)   . Bipolar 1 disorder   . Depression    Axis IV: Unchanged Axis V: 51-60 moderate symptoms  ADL's:  Intact  Sleep: Good  Appetite:  Good  Suicidal Ideation:  None Homicidal Ideation:  None  Subjective:  [name] Whitney reports she is doing extremely well and wants to be discharged.  She feels her meds are adjusted well.  She denies any SI/HI or AVH. Sleep and appetite are good.  AEB (as evidenced by):  Mental Status Examination/Evaluation: Objective:  Appearance: Well Groomed  Eye Contact::  Good  Speech:  Clear and Coherent  Volume:  Normal  Mood:  Euthymic  Affect:  Appropriate  Thought Process:  Goal Directed and Linear  Orientation:  Full  Thought Content:  WDL  Suicidal Thoughts:  No  Homicidal Thoughts:  No  Memory:  Remote;   Good  Judgement:  Fair  Insight:  Fair  Psychomotor Activity:  Normal  Concentration:  Good  Recall:  Good  Akathisia:  No  Handed:    AIMS (if indicated):     Assets:  Communication Skills Desire for Improvement Physical Health  Sleep:  Number of Hours: 6    Vital Signs:Blood pressure 107/75, pulse 94, temperature 97.1 F (36.2 C), temperature source Oral, resp. rate 18, height 5\' 7"  (1.702 m), weight 73.029 kg (161 lb), last menstrual period 05/21/2011. Current Medications: Current Facility-Administered Medications  Medication Dose Route Frequency Provider Last Rate Last Dose  . acetaminophen (TYLENOL) tablet 650 mg  650 mg Oral Q6H PRN Ronny Bacon, MD   650 mg at 06/11/11 0524  . alum & mag hydroxide-simeth (MAALOX/MYLANTA) 200-200-20 MG/5ML suspension 30 mL  30 mL Oral Q4H PRN Curlene Labrum Readling, MD      . benztropine (COGENTIN) tablet 1 mg  1 mg Oral BID PRN Mike Craze, MD       . chlorproMAZINE (THORAZINE) tablet 10 mg  10 mg Oral TID Mike Craze, MD   10 mg at 06/13/11 0855  . hydrOXYzine (ATARAX/VISTARIL) tablet 50 mg  50 mg Oral QID PRN Mike Craze, MD   50 mg at 06/12/11 2008  . hydrOXYzine (ATARAX/VISTARIL) tablet 50 mg  50 mg Oral QHS PRN,MR X 1 Mike Craze, MD   50 mg at 06/11/11 2355  . ibuprofen (ADVIL,MOTRIN) tablet 600 mg  600 mg Oral Q6H PRN Mike Craze, MD   600 mg at 06/13/11 1319  . magnesium hydroxide (MILK OF MAGNESIA) suspension 30 mL  30 mL Oral Daily PRN Curlene Labrum Readling, MD      . nicotine (NICODERM CQ - dosed in mg/24 hours) patch 14 mg  14 mg Transdermal Daily Mike Craze, MD   14 mg at 06/13/11 0700  . pantoprazole (PROTONIX) EC tablet 40 mg  40 mg Oral Daily Mike Craze, MD   40 mg at 06/13/11 0855  . prazosin (MINIPRESS) capsule 1 mg  1 mg Oral QHS,MR X 1 Mike Craze, MD   1 mg at 06/12/11 2126  . propranolol (INDERAL) tablet 20 mg  20 mg Oral BID Mike Craze, MD   20 mg at 06/13/11 0855    Lab Results: No results  found for this or any previous visit (from the past 48 hour(s)).  Physical Findings: AIMS:  , ,  ,  ,    CIWA:  CIWA-Ar Total: 1  COWS:  COWS Total Score: 1   Treatment Plan Summary: Daily contact with patient to assess and evaluate symptoms and progress in treatment Medication management  Plan: We will continue her current plan of care and likely discharge in the morning.  [name] Whitney 06/13/2011, 1:59 PM

## 2011-06-13 NOTE — Progress Notes (Signed)
Pt has not attended the groups today. Slept this morning and sat in her room during group therapy. Affect is brighter and mood less depressed since she has found out that she will be able to go and live with her parents. States she is not sure at all what she will do with her son, mostly because he has been in 3 schools already this year and is trying to do what is best for him. Denies SI and HI. Rates her depression as a 3 and her depression as a 1. Given support and reassurance.

## 2011-06-13 NOTE — Progress Notes (Signed)
[facility] Group Notes:  (Counselor/Nursing/MHT/Case Management/Adjunct)  06/13/2011 1315  Type of Therapy:  Group Therapy  Participation Level:  Did Not Attend  Crosswell, Desiree 06/13/2011, 4:29 PM 

## 2011-06-14 DIAGNOSIS — F410 Panic disorder [episodic paroxysmal anxiety] without agoraphobia: Principal | ICD-10-CM | POA: Diagnosis present

## 2011-06-14 DIAGNOSIS — F4322 Adjustment disorder with anxiety: Secondary | ICD-10-CM

## 2011-06-14 MED ORDER — HYDROXYZINE HCL 50 MG PO TABS: 50.0000 mg | ORAL_TABLET | Freq: Every evening | ORAL | Status: AC | PRN

## 2011-06-14 MED ORDER — CHLORPROMAZINE HCL 10 MG PO TABS: 10.0000 mg | ORAL_TABLET | Freq: Three times a day (TID) | ORAL | Status: AC

## 2011-06-14 MED ORDER — PROPRANOLOL HCL 20 MG PO TABS: 20.0000 mg | ORAL_TABLET | Freq: Two times a day (BID) | ORAL | Status: DC

## 2011-06-14 MED ORDER — PANTOPRAZOLE SODIUM 40 MG PO TBEC: 40.0000 mg | DELAYED_RELEASE_TABLET | Freq: Every day | ORAL | Status: DC

## 2011-06-14 MED ORDER — BENZTROPINE MESYLATE 1 MG PO TABS: 1.0000 mg | ORAL_TABLET | Freq: Two times a day (BID) | ORAL | Status: AC | PRN

## 2011-06-14 NOTE — Discharge Summary (Signed)
I agree with this D/C Summary.  

## 2011-06-14 NOTE — [facility] Suicide Risk Assessment (Signed)
Suicide Risk Assessment  Discharge Assessment     Demographic factors:       Current Mental Status Per Nursing Assessment::   On Admission:    At Discharge:     Current Mental Status Per Physician: ADL's:  Intact  Sleep: Good  Appetite:  Good  Suicidal Ideation:  Denies adamantly any suicidal thoughts. Homicidal Ideation:  Denies adamantly any homicidal thoughts.  Mental Status Examination/Evaluation: Objective:  Appearance: Casual  Eye Contact::  Good  Speech:  Clear and Coherent  Volume:  Normal  Mood:  Euthymic  Affect:  Congruent  Thought Process:  Coherent  Orientation:  Full  Thought Content:  WDL  Suicidal Thoughts:  No  Homicidal Thoughts:  No  Memory:  Immediate;   Good  Judgement:  Good  Insight:  Good  Psychomotor Activity:  Normal  Concentration:  Good  Recall:  Good  Akathisia:  No  AIMS (if indicated):     Assets:  Communication Skills Desire for Improvement Financial Resources/Insurance Housing Physical Health Resilience Social Support Talents/Skills  Sleep: Number of Hours: 4.75    Vital Signs: Blood pressure 116/84, pulse 83, temperature 98.5 F (36.9 C), temperature source Oral, resp. rate 20, height 5\' 7"  (1.702 m), weight 73.029 kg (161 lb), last menstrual period 05/21/2011.  Labs No results found for this or any previous visit (from the past 72 hour(s)).  RISK REDUCTION FACTORS: What pt has learned from hospital stay is that Thorazine really works and she must stay on the meds and take them when they are prescribed.  Risk of self harm is elevated by her diagnosis, but she has her son and her life to live for.  She says. "I've got to make it better for myself and like a domino effect it will be better for my son."  Risk of harm to others is minimal in that she has not been involved in fights or had any legal charges filed on her.  PLAN: Discharge home Continue Medication List  As of 06/14/2011  2:02 PM   STOP taking these  medications         haloperidol 5 MG tablet      LATUDA 80 MG Tabs      LORazepam 2 MG tablet         TAKE these medications         benztropine 1 MG tablet   Commonly known as: COGENTIN   Take 1 tablet (1 mg total) by mouth 2 (two) times daily as needed. For involuntary movements      chlorproMAZINE 10 MG tablet   Commonly known as: THORAZINE   Take 1 tablet (10 mg total) by mouth 3 (three) times daily. For mood control and anxiety control      hydrOXYzine 50 MG tablet   Commonly known as: ATARAX/VISTARIL   Take 1 tablet (50 mg total) by mouth at bedtime as needed and may repeat dose one time if needed (insomnia). For sleep      pantoprazole 40 MG tablet   Commonly known as: PROTONIX   Take 1 tablet (40 mg total) by mouth daily.      propranolol 20 MG tablet   Commonly known as: INDERAL   Take 1 tablet (20 mg total) by mouth 2 (two) times daily. For social anxiety           Continued Clinical Symptoms:  Severe Anxiety and/or Agitation Previous Psychiatric Diagnoses and Treatments  Discharge Diagnoses:   AXIS I:  Panic  Disorder AXIS II:  Deferred AXIS III:   Past Medical History  Diagnosis Date  . Hypertension   . Migraine headache   . Depression   . Irritable bowel syndrome (IBS)   . Bipolar 1 disorder   . Depression    AXIS IV:  other psychosocial or environmental problems AXIS V:  51-60 moderate symptoms  Cognitive Features That Contribute To Risk:  Thought constriction (tunnel vision)    Suicide Risk:  Minimal: No identifiable suicidal ideation.  Patients presenting with no risk factors but with morbid ruminations; may be classified as minimal risk based on the severity of the depressive symptoms  Plan Of Care/Follow-up recommendations:  Activities: Resume typical activities Diet: Resume typical diet Other: Follow up with outpatient provider and report any side effects to out patient prescriber.  [name] Whitney 06/14/2011, 2:00 PM

## 2011-06-14 NOTE — Progress Notes (Signed)
Patient's self inventory sheet, sleeps well, has good appetite, normal energy level, good attention span.  Rated depression and hopelessness #1.  Denied SI.   Pain gaol today #1, worst pain #1.  Parents are going to help her after discharge.   Feels ready to go home.  No problems taking meds after discharge.  Patient's dad plans to pick her up after work today and take to parents' home.  Patient is excited to be discharged and stated she is feeling much better now.   Meds are working for her.

## 2011-06-14 NOTE — Progress Notes (Signed)
[facility] Group Notes:  (Counselor/Nursing/MHT/Case Management/Adjunct)  06/14/2011 12:04 PM  Type of Therapy:  Group Therapy  Participation Level:  Did Not Attend  :   Luanna Cole 06/14/2011, 12:04 PM

## 2011-06-14 NOTE — Tx Team (Signed)
Interdisciplinary Treatment Plan Update (Adult)  Date:  06/14/2011  Time Reviewed:  10:42 AM   Progress in Treatment: Attending groups:   Yes   Participating in groups:  Yes Taking medication as prescribed:  Yes Tolerating medication:  Yes Family/Significant othe contact made:  Patient understands diagnosis:  Yes Discussing patient identified problems/goals with staff: Yes Medical problems stabilized or resolved: Yes Denies suicidal/homicidal ideation:Yes Issues/concerns per patient self-inventory:  Other:  New problem(s) identified:  Reason for Continuation of Hospitalization:  Interventions implemented related to continuation of hospitalization:  Additional comments:  Estimated length of stay:  Discharge Plan:  New goal(s):  Review of initial/current patient goals per problem list:    1.  Goal(s):  Eliminate SI  Met:  Yes  Target date: d/c  As evidenced by:  Patient is no longer endorsing SI  2.  Goal (s): Reduce depression/anxiety  Met:  Yes  Target date: d/c  As evidenced [id number] is currently rating depression at one and anxiety at two; rated at   3.  Goal(s):  Stabilize on medications  Met:  Yes  Target date: d/c  As evidenced by:  [name] reports meds are "perfect"  4.  Goal(s): Schedule outpatient follow up  Met:  Yes  Target date: d/c  As evidenced by: Follow up scheduled with Hinda Kehr  Attendees: Patient:     Family:     Physician:  Orson Aloe, MD 06/14/2011 10:42 AM   Nursing:    06/14/2011 10:42 AM   CaseManager:  Juline Patch, LCSW 06/14/2011 10:42 AM   Counselor:  Angus Palms, LCSW 06/14/2011 10:42 AM   Other:  Consuello Bossier, NP 06/14/2011 10:42 AM   Other:  Reyes Ivan, LCSWA 06/14/2011  10:42 AM   Other:     Other:      Scribe for Treatment Team:   Wynn Banker, LCSW,  06/14/2011 10:42 AM

## 2011-06-14 NOTE — Discharge Summary (Signed)
Physician Discharge Summary Note  Patient:  [name] Whitney is an 26 y.o., female MRN:  [id number] DOB:  [birth year] Patient phone:  [phone] (home)  Patient address:   [address] [address] [place] [address],   Date of Admission:  06/08/2011 Date of Discharge: 06/14/11  Reason for Admission: Anxiety attacks  Discharge Diagnoses: Principal Problem:  *Panic disorder without agoraphobia Active Problems:  Adjustment disorder with anxiety   Axis Diagnosis:   AXIS I:  Adjustment Disorder with Anxiety AXIS II:  Deferred AXIS III:   Past Medical History  Diagnosis Date  . Hypertension   . Migraine headache   . Depression   . Irritable bowel syndrome (IBS)   . Bipolar 1 disorder   . Depression    AXIS IV:  economic problems, housing problems, occupational problems and other psychosocial or environmental problems AXIS V:  68  Level of Care:  OP  Hospital Course: History of Present Illness:  Presented to [facility] 3/11 c/o sob from anxiety/panic. Says she is homeless as her grandparents "kicked her out" 3 days ago. She also reports 2 recent hospitalizations at [facility] when she was having VH. Told intake person she was prescribed Latuda 40mg  QD Haldol 5-10 mg BID Cogentin 1mg  BID Ativan 1mg  Q8h Inderal 20 mg Q12h and Ambien 5mg  at hs.Said she was only taking the Ativan as she had run out of the other meds and never went for post discharge appointment.   While a patient in this hospital, patient received medication management as well as group counseling. Patient's medications Latuda, Haldol and were discontinued as patient had indicated that these combinations of medication do not help her symptoms. Patient was started on Thorazine 10 mg tid, Vistaril 50 mg Q bedtime for sleep, Propranaolo 10 mg bid for social anxiety and Cogentin 1 mg bid prn. Patient participated actively in group counseling. She reports on daily basis her decreased symptoms of anxiety and improved sleep. She met with the  treatment team this am and agreed with the team that she is ready for a discharge. Patient reports that she is feeling a lot better and believed that her medications are working very well for her. She added that her parents has agreed for her to return to their home after discharge and her father will be picking her up after work. She is most relieved that she is not going to be homeless after discharge.  Patient will continue psychiatric care on an outpatient basis at the [facility] on 06/15/11. The address, date and time for this appointment provided for patient. She is aware that her next Invega injection is scheduled for 06/15/11. Patient left [facility] with all personal belongings in apparent distress via family transport.   Consults:  None  Significant Diagnostic Studies:  None  Discharge Vitals:   Blood pressure 116/84, pulse 83, temperature 98.5 F (36.9 C), temperature source Oral, resp. rate 20, height 5\' 7"  (1.702 m), weight 73.029 kg (161 lb), last menstrual period 05/21/2011.  Mental Status Exam: See Mental Status Examination and Suicide Risk Assessment completed by Attending Physician prior to discharge.  Discharge destination:  Home  Is patient on multiple antipsychotic therapies at discharge:  No   Has Patient had three or more failed trials of antipsychotic monotherapy by history:  No  Recommended Plan for Multiple Antipsychotic Therapies: NA   Medication List  As of 06/14/2011 10:39 AM   STOP taking these medications         haloperidol 5 MG tablet  LATUDA 80 MG Tabs      LORazepam 2 MG tablet         TAKE these medications      Indication    benztropine 1 MG tablet   Commonly known as: COGENTIN   Take 1 tablet (1 mg total) by mouth 2 (two) times daily as needed. For involuntary movements       chlorproMAZINE 10 MG tablet   Commonly known as: THORAZINE   Take 1 tablet (10 mg total) by mouth 3 (three) times daily. For mood control and anxiety control        hydrOXYzine 50 MG tablet   Commonly known as: ATARAX/VISTARIL   Take 1 tablet (50 mg total) by mouth at bedtime as needed and may repeat dose one time if needed (insomnia). For sleep       pantoprazole 40 MG tablet   Commonly known as: PROTONIX   Take 1 tablet (40 mg total) by mouth daily.       propranolol 20 MG tablet   Commonly known as: INDERAL   Take 1 tablet (20 mg total) by mouth 2 (two) times daily. For social anxiety            Follow-up Information    Follow up with Hinda Kehr  on 06/15/2011. (You are scheduled for an Invega injection on Tuesday, June 15, 2011 at 5:00 p.m.)    Contact information:   [address] [place], [place]  [address]  [phone]         Follow-up recommendations:  Other:  Keep all scheduled follow-up appointments as recommended.  Comments:  Take your medications as prescribed.                       Report any adverse effects of medications promptly to your outpatient provider.  SignedArmandina Stammer I 06/14/2011, 10:39 AM

## 2011-06-14 NOTE — Progress Notes (Signed)
[facility] Group Notes: (Counselor/Nursing/MHT/Case Management/Adjunct) 06/14/2011   @1 :15pm  Type of Therapy:  Group Therapy  Participation Level:  Did Not Attend   Billie Lade 06/14/2011 2:55 PM

## 2011-06-14 NOTE — Progress Notes (Addendum)
Patient ID: [name] Whitney, female   DOB: [birth year], 26 y.o.   MRN: [id number] Discharge Note:   Patient's dad picked up patient to go to parents' home.   Patient denied SI and HI.  Denied A/V hallucinations.   Denied pain.  Suicide prevention information given to patient, discussed with patient, who stated she understands and has no questions.  Patient stated she appreciated all the staff has done to assist her.   Patient's received all her belongings, medications, prescriptions, miscellaneous items, clothing, ipod, charger, purse, wallet, coat, shoes,  Patient stated she received all her meds that she brought into the hospital.

## 2011-06-18 NOTE — Progress Notes (Signed)
Patient Discharge Instructions:  Psychiatric Admission Assessment Note Provided,  06/17/2011 After Visit Summary (AVS) Provided,  06/17/2011 Face Sheet Provided, 06/17/2011 Faxed/Sent to the Next Level Care provider:  06/17/2011 Sent Suicide Risk Assessment - Discharge Assessment 06/17/2011  Faxed to [facility] @ [phone]  Wandra Scot, 06/18/2011, 5:00 PM

## 2011-06-24 NOTE — ED Provider Notes (Signed)
Evaluation and management procedures were performed by the PA/NP under my supervision/collaboration.   Ileah Falkenstein D Charnell Peplinski, MD 06/24/11 1015 

## 2012-01-02 ENCOUNTER — Encounter (HOSPITAL_BASED_OUTPATIENT_CLINIC_OR_DEPARTMENT_OTHER): Payer: Self-pay | Admitting: *Deleted

## 2012-01-02 ENCOUNTER — Emergency Department (HOSPITAL_BASED_OUTPATIENT_CLINIC_OR_DEPARTMENT_OTHER)
Admission: EM | Admit: 2012-01-02 | Discharge: 2012-01-02 | Disposition: A | Discharge status: Home / Self Care | Payer: Self-pay | Attending: Emergency Medicine | Admitting: Emergency Medicine

## 2012-01-02 DIAGNOSIS — F319 Bipolar disorder, unspecified: Secondary | ICD-10-CM | POA: Insufficient documentation

## 2012-01-02 DIAGNOSIS — F17200 Nicotine dependence, unspecified, uncomplicated: Secondary | ICD-10-CM | POA: Insufficient documentation

## 2012-01-02 DIAGNOSIS — I10 Essential (primary) hypertension: Secondary | ICD-10-CM | POA: Insufficient documentation

## 2012-01-02 DIAGNOSIS — N390 Urinary tract infection, site not specified: Secondary | ICD-10-CM | POA: Insufficient documentation

## 2012-01-02 DIAGNOSIS — K589 Irritable bowel syndrome without diarrhea: Secondary | ICD-10-CM | POA: Insufficient documentation

## 2012-01-02 LAB — URINE MICROSCOPIC-ADD ON

## 2012-01-02 LAB — URINALYSIS, ROUTINE W REFLEX MICROSCOPIC
Bilirubin Urine: NEGATIVE
Glucose, UA: NEGATIVE mg/dL
Nitrite: POSITIVE — AB
Specific Gravity, Urine: 1.018 (ref 1.005–1.030)
pH: 6.5 (ref 5.0–8.0)

## 2012-01-02 MED ORDER — CIPROFLOXACIN HCL 500 MG PO TABS: 500.0000 mg | ORAL_TABLET | Freq: Two times a day (BID) | ORAL | Status: DC

## 2012-01-02 MED ORDER — HYDROCODONE-ACETAMINOPHEN 5-325 MG PO TABS: 2.0000 | ORAL_TABLET | ORAL | Status: DC | PRN

## 2012-01-02 NOTE — ED Provider Notes (Signed)
History     CSN: [id number]  Arrival date & time 01/02/12  2118   First MD Initiated Contact with Patient 01/02/12 2306      Chief Complaint  Patient presents with  . Urinary Tract Infection    (Consider location/radiation/quality/duration/timing/severity/associated sxs/prior treatment) Patient is a 26 y.o. female presenting with dysuria. The history is provided by the patient. No language interpreter was used.  Dysuria  This is a new problem. The current episode started 2 days ago. The problem has not changed since onset.The pain is at a severity of 6/10. The pain is moderate. There has been no fever. She is not sexually active. There is no history of pyelonephritis. She has tried nothing for the symptoms. Her past medical history is significant for recurrent UTIs.  Pt complains of burning with urination.  Past Medical History  Diagnosis Date  . Hypertension   . Migraine headache   . Depression   . Irritable bowel syndrome (IBS)   . Bipolar 1 disorder   . Depression     History reviewed. No pertinent past surgical history.  History reviewed. No pertinent family history.  History  Substance Use Topics  . Smoking status: Current Every Day Smoker -- 0.5 packs/day for 3 years    Types: Cigarettes  . Smokeless tobacco: Not on file  . Alcohol Use: Yes     has tasted alcohol age 66 yrs,, no longer uses    OB History    Grav Para Term Preterm Abortions TAB SAB Ect Mult Living                  Review of Systems  Genitourinary: Positive for dysuria.  All other systems reviewed and are negative.    Allergies  Compazine; Promethazine hcl; and Risperidone and related  Home Medications   Current Outpatient Rx  Name Route Sig Dispense Refill  . BENZTROPINE MESYLATE 1 MG PO TABS Oral Take 1 tablet (1 mg total) by mouth 2 (two) times daily as needed. For involuntary movements 60 tablet 0  . PANTOPRAZOLE SODIUM 40 MG PO TBEC Oral Take 1 tablet (40 mg total) by mouth  daily. 30 tablet 0  . PROPRANOLOL HCL 20 MG PO TABS Oral Take 1 tablet (20 mg total) by mouth 2 (two) times daily. For social anxiety 60 tablet 0    BP 135/99  Pulse 112  Temp 98.3 F (36.8 C) (Oral)  Resp 20  Ht 5\' 8"  (1.727 m)  Wt 160 lb (72.576 kg)  BMI 24.33 kg/m2  SpO2 96%  LMP 12/26/2011  Physical Exam  Nursing note and vitals reviewed. Constitutional: She appears well-developed and well-nourished.  HENT:  Head: Normocephalic and atraumatic.  Neck: Normal range of motion.  Pulmonary/Chest: Effort normal and breath sounds normal.  Abdominal: Soft. Bowel sounds are normal.  Musculoskeletal: Normal range of motion.  Neurological: She is alert.  Skin: Skin is warm.  Psychiatric: She has a normal mood and affect.    ED Course  Procedures (including critical care time)  Labs Reviewed  URINALYSIS, ROUTINE W REFLEX MICROSCOPIC - Abnormal; Notable for the following:    Color, Urine ORANGE (*)  BIOCHEMICALS MAY BE AFFECTED BY COLOR   APPearance TURBID (*)     Hgb urine dipstick SMALL (*)     Protein, ur 30 (*)     Nitrite POSITIVE (*)     Leukocytes, UA LARGE (*)     All other components within normal limits  URINE MICROSCOPIC-ADD ON -  Abnormal; Notable for the following:    Squamous Epithelial / LPF FEW (*)     Bacteria, UA MANY (*)     All other components within normal limits  PREGNANCY, URINE   No results found.   1. UTI (lower urinary tract infection)       MDM  Pt counseled on uti's.   Pt given referral to Dr. Rodena Medin for primary care        Elson Areas, [place] 01/02/12 2314  Lonia Skinner [place], [place] 01/02/12 2315

## 2012-01-02 NOTE — ED Notes (Signed)
Pt states she has a hx of UTI's and this feels like one. C/O low back pain, frequency and burning with urination

## 2012-01-02 NOTE — ED Provider Notes (Signed)
Medical screening examination/treatment/procedure(s) were performed by non-physician practitioner and as supervising physician I was immediately available for consultation/collaboration.   Dione Booze, MD 01/02/12 [phone]

## 2012-10-22 ENCOUNTER — Encounter (HOSPITAL_COMMUNITY): Payer: Self-pay | Admitting: Emergency Medicine

## 2012-10-22 ENCOUNTER — Emergency Department (HOSPITAL_COMMUNITY)
Admission: EM | Admit: 2012-10-22 | Discharge: 2012-10-22 | Disposition: A | Discharge status: Home / Self Care | Payer: Self-pay | Attending: Emergency Medicine | Admitting: Emergency Medicine

## 2012-10-22 DIAGNOSIS — Z8719 Personal history of other diseases of the digestive system: Secondary | ICD-10-CM | POA: Insufficient documentation

## 2012-10-22 DIAGNOSIS — F17200 Nicotine dependence, unspecified, uncomplicated: Secondary | ICD-10-CM | POA: Insufficient documentation

## 2012-10-22 DIAGNOSIS — Z79899 Other long term (current) drug therapy: Secondary | ICD-10-CM | POA: Insufficient documentation

## 2012-10-22 DIAGNOSIS — G43909 Migraine, unspecified, not intractable, without status migrainosus: Secondary | ICD-10-CM | POA: Insufficient documentation

## 2012-10-22 DIAGNOSIS — J039 Acute tonsillitis, unspecified: Secondary | ICD-10-CM

## 2012-10-22 DIAGNOSIS — F319 Bipolar disorder, unspecified: Secondary | ICD-10-CM | POA: Insufficient documentation

## 2012-10-22 DIAGNOSIS — I10 Essential (primary) hypertension: Secondary | ICD-10-CM | POA: Insufficient documentation

## 2012-10-22 LAB — RAPID STREP SCREEN (MED CTR MEBANE ONLY): Streptococcus, Group A Screen (Direct): NEGATIVE

## 2012-10-22 MED ORDER — CLINDAMYCIN HCL 150 MG PO CAPS: 150.0000 mg | ORAL_CAPSULE | Freq: Four times a day (QID) | ORAL | Status: DC

## 2012-10-22 NOTE — ED Provider Notes (Signed)
Medical screening examination/treatment/procedure(s) were performed by non-physician practitioner and as supervising physician I was immediately available for consultation/collaboration. Devoria Albe, MD, Armando Gang   Ward Givens, MD 10/22/12 2011

## 2012-10-22 NOTE — ED Notes (Signed)
States that she has had a sore throat for approximately 3 days, states that she has "pus" on the right side of her throat.

## 2012-10-22 NOTE — ED Provider Notes (Signed)
CSN: [id number]     Arrival date & time 10/22/12  1523 History     First MD Initiated Contact with Patient 10/22/12 1534     Chief Complaint  Patient presents with  . Sore Throat   (Consider location/radiation/quality/duration/timing/severity/associated sxs/prior Treatment) Patient is a 27 y.o. female presenting with pharyngitis. The history is provided by the patient.  Sore Throat This is a new problem. The current episode started in the past 7 days. The problem occurs constantly. The problem has been unchanged. Associated symptoms include a sore throat and swollen glands. Pertinent negatives include no chills, coughing, fever, headaches, nausea, rash or vomiting. The symptoms are aggravated by eating. She has tried NSAIDs for the symptoms. The treatment provided mild relief.   [name] Whitney is a 27 y.o. female who presents to the ED with a sore throat that started 3 days ago. She has not had documented fever but has been taking ibuprofen regularly for the pain. She noted white patches on her right tonsil. The pain increases with eating.  Past Medical History  Diagnosis Date  . Hypertension   . Migraine headache   . Depression   . Irritable bowel syndrome (IBS)   . Bipolar 1 disorder   . Depression    History reviewed. No pertinent past surgical history. No family history on file. History  Substance Use Topics  . Smoking status: Current Every Day Smoker -- 0.50 packs/day for 3 years    Types: Cigarettes  . Smokeless tobacco: Not on file  . Alcohol Use: Yes     Comment: has tasted alcohol age 67 yrs,, no longer uses   OB History   Grav Para Term Preterm Abortions TAB SAB Ect Mult Living                 Review of Systems  Constitutional: Negative for fever and chills.  HENT: Positive for sore throat.   Respiratory: Negative for cough.   Gastrointestinal: Negative for nausea and vomiting.  Musculoskeletal: Negative for back pain.  Skin: Negative for rash.  Neurological:  Negative for headaches.  Psychiatric/Behavioral: The patient is not nervous/anxious.     Allergies  Compazine; Promethazine hcl; and Risperidone and related  Home Medications   Current Outpatient Rx  Name  Route  Sig  Dispense  Refill  . ciprofloxacin (CIPRO) 500 MG tablet   Oral   Take 1 tablet (500 mg total) by mouth every 12 (twelve) hours.   10 tablet   0   . HYDROcodone-acetaminophen (NORCO/VICODIN) 5-325 MG per tablet   Oral   Take 2 tablets by mouth every 4 (four) hours as needed for pain.   10 tablet   0   . pantoprazole (PROTONIX) 40 MG tablet   Oral   Take 1 tablet (40 mg total) by mouth daily.   30 tablet   0   . propranolol (INDERAL) 20 MG tablet   Oral   Take 1 tablet (20 mg total) by mouth 2 (two) times daily. For social anxiety   60 tablet   0    BP 152/103  Pulse 96  Temp(Src) 98.8 F (37.1 C) (Oral)  Resp 16  SpO2 99%  LMP 10/08/2012 Physical Exam  Nursing note and vitals reviewed. Constitutional: She is oriented to person, place, and time. She appears well-developed and well-nourished. No distress.  HENT:  Head: Normocephalic.  Right Ear: Tympanic membrane normal.  Left Ear: Tympanic membrane normal.  Mouth/Throat: Uvula is midline and mucous membranes are  normal. Posterior oropharyngeal erythema present.  Exudate right tonsil, no abscess  Eyes: EOM are normal.  Neck: Normal range of motion. Neck supple.  Cardiovascular: Normal rate and regular rhythm.   Pulmonary/Chest: Effort normal and breath sounds normal.  Abdominal: Soft. There is no tenderness.  Musculoskeletal: Normal range of motion.  Lymphadenopathy:    She has cervical adenopathy (right).  Neurological: She is alert and oriented to person, place, and time. No cranial nerve deficit.  Skin: Skin is warm and dry.  Psychiatric: She has a normal mood and affect. Her behavior is normal.   Results for orders placed during the hospital encounter of 10/22/12 (from the past 24  hour(s))  RAPID STREP SCREEN     Status: None   Collection Time    10/22/12  3:43 PM      Result Value Range   Streptococcus, Group A Screen (Direct) NEGATIVE  NEGATIVE  MONONUCLEOSIS SCREEN     Status: None   Collection Time    10/22/12  4:19 PM      Result Value Range   Mono Screen NEGATIVE  NEGATIVE    ED Course   Procedures  MDM  27 y.o. female with sore throat x 3 days. Exudative tonsillitis will treat with antibiotics and she will return for any problems.    Medication List    TAKE these medications       clindamycin 150 MG capsule  Commonly known as:  CLEOCIN  Take 1 capsule (150 mg total) by mouth every 6 (six) hours.      ASK your doctor about these medications       ibuprofen 200 MG tablet  Commonly known as:  ADVIL,MOTRIN  Take 200-1,000 mg by mouth every 6 (six) hours as needed for pain.         [facility] Orlene Och, [place] 10/22/12 1714

## 2012-10-24 LAB — CULTURE, GROUP A STREP

## 2014-10-30 ENCOUNTER — Emergency Department (HOSPITAL_COMMUNITY): Payer: Medicaid Other

## 2014-10-30 ENCOUNTER — Encounter (HOSPITAL_COMMUNITY): Payer: Self-pay | Admitting: Emergency Medicine

## 2014-10-30 ENCOUNTER — Emergency Department (HOSPITAL_COMMUNITY)
Admission: EM | Admit: 2014-10-30 | Discharge: 2014-10-30 | Disposition: A | Discharge status: Home / Self Care | Payer: Medicaid Other | Attending: Emergency Medicine | Admitting: Emergency Medicine

## 2014-10-30 DIAGNOSIS — Y929 Unspecified place or not applicable: Secondary | ICD-10-CM | POA: Diagnosis not present

## 2014-10-30 DIAGNOSIS — W231XXA Caught, crushed, jammed, or pinched between stationary objects, initial encounter: Secondary | ICD-10-CM | POA: Insufficient documentation

## 2014-10-30 DIAGNOSIS — Y939 Activity, unspecified: Secondary | ICD-10-CM | POA: Diagnosis not present

## 2014-10-30 DIAGNOSIS — F319 Bipolar disorder, unspecified: Secondary | ICD-10-CM | POA: Diagnosis not present

## 2014-10-30 DIAGNOSIS — I10 Essential (primary) hypertension: Secondary | ICD-10-CM | POA: Insufficient documentation

## 2014-10-30 DIAGNOSIS — Z79899 Other long term (current) drug therapy: Secondary | ICD-10-CM | POA: Diagnosis not present

## 2014-10-30 DIAGNOSIS — Y999 Unspecified external cause status: Secondary | ICD-10-CM | POA: Diagnosis not present

## 2014-10-30 DIAGNOSIS — Z8719 Personal history of other diseases of the digestive system: Secondary | ICD-10-CM | POA: Diagnosis not present

## 2014-10-30 DIAGNOSIS — G43909 Migraine, unspecified, not intractable, without status migrainosus: Secondary | ICD-10-CM | POA: Diagnosis not present

## 2014-10-30 DIAGNOSIS — S60511A Abrasion of right hand, initial encounter: Secondary | ICD-10-CM | POA: Diagnosis not present

## 2014-10-30 DIAGNOSIS — S6991XA Unspecified injury of right wrist, hand and finger(s), initial encounter: Secondary | ICD-10-CM | POA: Diagnosis present

## 2014-10-30 DIAGNOSIS — S60414A Abrasion of right ring finger, initial encounter: Secondary | ICD-10-CM | POA: Diagnosis not present

## 2014-10-30 DIAGNOSIS — S60416A Abrasion of right little finger, initial encounter: Secondary | ICD-10-CM | POA: Insufficient documentation

## 2014-10-30 DIAGNOSIS — Z720 Tobacco use: Secondary | ICD-10-CM | POA: Insufficient documentation

## 2014-10-30 DIAGNOSIS — T07XXXA Unspecified multiple injuries, initial encounter: Secondary | ICD-10-CM

## 2014-10-30 MED ORDER — NAPROXEN 500 MG PO TABS: 500.0000 mg | ORAL_TABLET | Freq: Two times a day (BID) | ORAL | Status: DC

## 2014-10-30 MED ORDER — HYDROCODONE-ACETAMINOPHEN 5-325 MG PO TABS
2.0000 | ORAL_TABLET | Freq: Once | ORAL | Status: AC
  Administered 2014-10-30: 2 via ORAL
  Filled 2014-10-30: qty 2

## 2014-10-30 MED ORDER — TRAMADOL HCL 50 MG PO TABS: 50.0000 mg | ORAL_TABLET | Freq: Four times a day (QID) | ORAL | Status: DC | PRN

## 2014-10-30 NOTE — ED Notes (Signed)
AVS explained in detail. Work note given. Ace wrap applied. No other c/c.

## 2014-10-30 NOTE — ED Notes (Signed)
Pt c/o right hand injury last Thursday. Reported slamming her hand in the car door. Has healing wounds on right and and one on the left hand. Slight swelling to right wrist. No other c/c.

## 2014-10-30 NOTE — ED Provider Notes (Signed)
CSN: [id number]     Arrival date & time 10/30/14  1725 History  This chart was scribed for non-physician practitioner Santiago Glad, PA-C working with Rolland Porter, MD by Littie Deeds, ED Scribe. This patient was seen in room WTR7/WTR7 and the patient's care was started at 5:52 PM.       Chief Complaint  Patient presents with  . Hand Injury   The history is provided by the patient. No language interpreter was used.   HPI Comments: [name] Whitney is a 29 y.o. female who presents to the Emergency Department complaining of sudden onset, throbbing right hand pain to the 4th and 5th fingers secondary to an injury in which she slammed her hand in a van door 6 days ago. Patient also reports falling on her outstretched right hand the same day. She reports pain radiating up her arm. No fever.  She has tried Advil but without relief. Patient denies fever and numbness. She also denies history of DM. She has not been seen previously for this injury.    Past Medical History  Diagnosis Date  . Hypertension   . Migraine headache   . Depression   . Irritable bowel syndrome (IBS)   . Bipolar 1 disorder   . Depression    History reviewed. No pertinent past surgical history. History reviewed. No pertinent family history. History  Substance Use Topics  . Smoking status: Current Every Day Smoker -- 0.50 packs/day for 3 years    Types: Cigarettes  . Smokeless tobacco: Not on file  . Alcohol Use: Yes     Comment: has tasted alcohol age 89 yrs,, no longer uses   OB History    No data available     Review of Systems  Constitutional: Positive for chills. Negative for fever.  Musculoskeletal: Positive for arthralgias.  Skin: Positive for color change and wound.  Neurological: Negative for numbness.      Allergies  Compazine; Promethazine hcl; and Risperidone and related  Home Medications   Prior to Admission medications   Medication Sig Start Date End Date Taking? Authorizing Provider   clonazePAM (KLONOPIN) 1 MG tablet Take 1 mg by mouth 3 (three) times daily.   Yes Historical Provider, MD  cloNIDine (CATAPRES) 0.1 MG tablet Take 0.1 mg by mouth 2 (two) times daily.   Yes Historical Provider, MD  clindamycin (CLEOCIN) 150 MG capsule Take 1 capsule (150 mg total) by mouth every 6 (six) hours. Patient not taking: Reported on 10/30/2014 10/22/12   Janne Napoleon, NP   BP 109/69 mmHg  Pulse 90  Temp(Src) 98.1 F (36.7 C) (Oral)  Resp 17  SpO2 99%  LMP 10/30/2014 (Exact Date) Physical Exam  Constitutional: She is oriented to person, place, and time. She appears well-developed and well-nourished. No distress.  HENT:  Head: Normocephalic and atraumatic.  Mouth/Throat: Oropharynx is clear and moist. No oropharyngeal exudate.  Eyes: Pupils are equal, round, and reactive to light.  Neck: Neck supple.  Cardiovascular: Normal rate and regular rhythm.   Pulses:      Radial pulses are 2+ on the right side.  Good cap refill of all fingers.  Pulmonary/Chest: Effort normal and breath sounds normal.  Musculoskeletal: She exhibits no edema.  Full ROM of right wrist. No snuffbox tenderness.  Neurological: She is alert and oriented to person, place, and time. No cranial nerve deficit.  Skin: Skin is warm and dry. No rash noted.  Healing abrasion of the proximal lateral right hand just distal  to the wrist, no drainage.  2 healing abrasions of the dorsal aspect of the 4th and 5th fingers at the DIP, no drainage or surrounding erythema.   Psychiatric: She has a normal mood and affect. Her behavior is normal.  Nursing note and vitals reviewed.   ED Course  Procedures  DIAGNOSTIC STUDIES: Oxygen Saturation is 99% on room air, normal by my interpretation.    COORDINATION OF CARE: 5:58 PM-Discussed treatment plan which includes XR imaging and pain medication with patient/guardian at bedside and patient/guardian agreed to plan.    Labs Review Labs Reviewed - No data to  display  Imaging Review Dg Hand Complete Right  10/30/2014   CLINICAL DATA:  Closed hand in car door 4 days ago, then fell on it. Hand pain and abrasions. Initial encounter.  EXAM: RIGHT HAND - COMPLETE 3+ VIEW  COMPARISON:  None.  FINDINGS: There is no evidence of fracture or dislocation. There is no evidence of arthropathy or other focal bone abnormality. Soft tissues are unremarkable.  IMPRESSION: Negative.   Electronically Signed   By: Myles Rosenthal M.D.   On: 10/30/2014 18:36     EKG Interpretation None      MDM   Final diagnoses:  None  Patient presents today with pain of the right hand after falling four days ago.  Xray is negative.  Patient with abrasions to the hand, but no signs of infection.  Neurovascularly intact.  Stable for discharge.  Return precautions given.  I personally performed the services described in this documentation, which was scribed in my presence. The recorded information has been reviewed and is accurate.    Santiago Glad, PA-C 10/30/14 2019  Rolland Porter, MD 11/05/14 [phone]

## 2015-04-07 ENCOUNTER — Emergency Department (HOSPITAL_BASED_OUTPATIENT_CLINIC_OR_DEPARTMENT_OTHER)
Admission: EM | Admit: 2015-04-07 | Discharge: 2015-04-07 | Disposition: A | Discharge status: Home / Self Care | Payer: Medicaid Other | Attending: Emergency Medicine | Admitting: Emergency Medicine

## 2015-04-07 ENCOUNTER — Encounter (HOSPITAL_BASED_OUTPATIENT_CLINIC_OR_DEPARTMENT_OTHER): Payer: Self-pay | Admitting: *Deleted

## 2015-04-07 DIAGNOSIS — F319 Bipolar disorder, unspecified: Secondary | ICD-10-CM | POA: Diagnosis not present

## 2015-04-07 DIAGNOSIS — Z79899 Other long term (current) drug therapy: Secondary | ICD-10-CM | POA: Diagnosis not present

## 2015-04-07 DIAGNOSIS — Z8719 Personal history of other diseases of the digestive system: Secondary | ICD-10-CM | POA: Diagnosis not present

## 2015-04-07 DIAGNOSIS — H109 Unspecified conjunctivitis: Secondary | ICD-10-CM

## 2015-04-07 DIAGNOSIS — I10 Essential (primary) hypertension: Secondary | ICD-10-CM | POA: Diagnosis not present

## 2015-04-07 DIAGNOSIS — F17210 Nicotine dependence, cigarettes, uncomplicated: Secondary | ICD-10-CM | POA: Diagnosis not present

## 2015-04-07 DIAGNOSIS — G43909 Migraine, unspecified, not intractable, without status migrainosus: Secondary | ICD-10-CM | POA: Insufficient documentation

## 2015-04-07 DIAGNOSIS — H578 Other specified disorders of eye and adnexa: Secondary | ICD-10-CM | POA: Diagnosis present

## 2015-04-07 MED ORDER — FLUORESCEIN SODIUM 1 MG OP STRP
ORAL_STRIP | OPHTHALMIC | Status: AC
  Filled 2015-04-07: qty 1

## 2015-04-07 MED ORDER — FLUORESCEIN SODIUM 1 MG OP STRP
1.0000 | ORAL_STRIP | Freq: Once | OPHTHALMIC | Status: AC
Start: 2015-04-07 — End: 2015-04-07
  Administered 2015-04-07: 1 via OPHTHALMIC

## 2015-04-07 MED ORDER — TETRACAINE HCL 0.5 % OP SOLN
OPHTHALMIC | Status: AC
  Filled 2015-04-07: qty 4

## 2015-04-07 MED ORDER — TRAMADOL HCL 50 MG PO TABS: 50.0000 mg | ORAL_TABLET | Freq: Four times a day (QID) | ORAL | Status: DC | PRN

## 2015-04-07 MED ORDER — OFLOXACIN 0.3 % OP SOLN: 2.0000 [drp] | OPHTHALMIC | Status: DC

## 2015-04-07 MED ORDER — TETRACAINE HCL 0.5 % OP SOLN
2.0000 [drp] | Freq: Once | OPHTHALMIC | Status: AC
  Administered 2015-04-07: 2 [drp] via OPHTHALMIC

## 2015-04-07 NOTE — ED Notes (Signed)
Pt c/o left eye redness and drainage x 1 day 

## 2015-04-07 NOTE — ED Provider Notes (Signed)
CSN: [id number]     Arrival date & time 04/07/15  1820 History  By signing my name below, I, Budd PalmerVanessa Prueter, attest that this documentation has been prepared under the direction and in the presence of Geoffery Lyonsouglas Danasia Baker, MD. Electronically Signed: Budd PalmerVanessa Prueter, ED Scribe. 04/07/2015. 6:48 PM.    Chief Complaint  Patient presents with  . Eye Drainage   The history is provided by the patient. No language interpreter was used.   HPI Comments: [name] Whitney is a 30 y.o. female smoker at 0.5 ppd with a PMHx of HTN who presents to the Emergency Department complaining of left eye drainage onset 3 days ago. Pt states that the day after onset she noticed some redness and suspected pinkeye. She reports that she then tried OTC eye drops without relief. She endorses associated worsening, constant pain and redness to the eye. She states she usually wears color contacts, but not for vision correction. She does not wear glasses. She denies any recent sick contacts.   Past Medical History  Diagnosis Date  . Hypertension   . Migraine headache   . Depression   . Irritable bowel syndrome (IBS)   . Bipolar 1 disorder (HCC)   . Depression    History reviewed. No pertinent past surgical history. History reviewed. No pertinent family history. Social History  Substance Use Topics  . Smoking status: Current Every Day Smoker -- 0.50 packs/day for 3 years    Types: Cigarettes  . Smokeless tobacco: None  . Alcohol Use: Yes     Comment: has tasted alcohol age 30 yrs,, no longer uses   OB History    No data available     Review of Systems  Eyes: Positive for pain, discharge and redness.  All other systems reviewed and are negative.   Allergies  Compazine; Promethazine hcl; and Risperidone and related  Home Medications   Prior to Admission medications   Medication Sig Start Date End Date Taking? Authorizing Provider  citalopram (CELEXA) 20 MG tablet Take 20 mg by mouth daily.   Yes Historical Provider,  MD  clonazePAM (KLONOPIN) 1 MG tablet Take 1 mg by mouth 3 (three) times daily.    Historical Provider, MD  cloNIDine (CATAPRES) 0.1 MG tablet Take 0.1 mg by mouth 2 (two) times daily.    Historical Provider, MD  naproxen (NAPROSYN) 500 MG tablet Take 1 tablet (500 mg total) by mouth 2 (two) times daily. 10/30/14   Heather Laisure, PA-C  traMADol (ULTRAM) 50 MG tablet Take 1 tablet (50 mg total) by mouth every 6 (six) hours as needed. 10/30/14   Heather Laisure, PA-C   BP 136/70 mmHg  Pulse 82  Temp(Src) 97.8 F (36.6 C)  Resp 18  Ht 5\' 8"  (1.727 m)  Wt 168 lb (76.204 kg)  BMI 25.55 kg/m2  SpO2 100%  LMP 03/18/2015 Physical Exam  Constitutional: She is oriented to person, place, and time. She appears well-developed and well-nourished.  HENT:  Head: Normocephalic and atraumatic.  Eyes: Pupils are equal, round, and reactive to light. Right eye exhibits no discharge. Left eye exhibits discharge.  L conjunctiva is injected, with clear discharge. Cornea is clear, anterior chamber is clear, and pupil is reactive.   Pulmonary/Chest: Effort normal. No respiratory distress.  Neurological: She is alert and oriented to person, place, and time. Coordination normal.  Skin: Skin is warm and dry. No rash noted. She is not diaphoretic. No erythema.  Psychiatric: She has a normal mood and affect.  Nursing  note and vitals reviewed.   ED Course  Procedures  DIAGNOSTIC STUDIES: Oxygen Saturation is 100% on RA, normal by my interpretation.    COORDINATION OF CARE: 6:45 PM - Discussed plans to order antibiotics and something for pain. Pt advised of plan for treatment and pt agrees.  Labs Review Labs Reviewed - No data to display  Imaging Review No results found. I have personally reviewed and evaluated these images and lab results as part of my medical decision-making.   EKG Interpretation None      MDM   Final diagnoses:  Conjunctivitis of left eye    Patient has a conjunctivitis of the  left eye. This will be treated with antibiotics eyedrops. She does wear contact lenses so will be prescribed Ocuflox. She is also complaining of pain and will be prescribed tramadol for this. To return as needed for any problems.  I personally performed the services described in this documentation, which was scribed in my presence. The recorded information has been reviewed and is accurate.       Geoffery Lyons, MD 04/07/15 2148

## 2015-04-07 NOTE — Discharge Instructions (Signed)
Ocuflox as prescribed.  Tramadol as prescribed as needed for pain.  Follow up with your eye doctor if not improving in the next three days.   Bacterial Conjunctivitis Bacterial conjunctivitis, commonly called pink eye, is an inflammation of the clear membrane that covers the white part of the eye (conjunctiva). The inflammation can also happen on the underside of the eyelids. The blood vessels in the conjunctiva become inflamed, causing the eye to become red or pink. Bacterial conjunctivitis may spread easily from one eye to another and from person to person (contagious).  CAUSES  Bacterial conjunctivitis is caused by bacteria. The bacteria may come from your own skin, your upper respiratory tract, or from someone else with bacterial conjunctivitis. SYMPTOMS  The normally white color of the eye or the underside of the eyelid is usually pink or red. The pink eye is usually associated with irritation, tearing, and some sensitivity to light. Bacterial conjunctivitis is often associated with a thick, yellowish discharge from the eye. The discharge may turn into a crust on the eyelids overnight, which causes your eyelids to stick together. If a discharge is present, there may also be some blurred vision in the affected eye. DIAGNOSIS  Bacterial conjunctivitis is diagnosed by your caregiver through an eye exam and the symptoms that you report. Your caregiver looks for changes in the surface tissues of your eyes, which may point to the specific type of conjunctivitis. A sample of any discharge may be collected on a cotton-tip swab if you have a severe case of conjunctivitis, if your cornea is affected, or if you keep getting repeat infections that do not respond to treatment. The sample will be sent to a lab to see if the inflammation is caused by a bacterial infection and to see if the infection will respond to antibiotic medicines. TREATMENT   Bacterial conjunctivitis is treated with antibiotics.  Antibiotic eyedrops are most often used. However, antibiotic ointments are also available. Antibiotics pills are sometimes used. Artificial tears or eye washes may ease discomfort. HOME CARE INSTRUCTIONS   To ease discomfort, apply a cool, clean washcloth to your eye for 10-20 minutes, 3-4 times a day.  Gently wipe away any drainage from your eye with a warm, wet washcloth or a cotton ball.  Wash your hands often with soap and water. Use paper towels to dry your hands.  Do not share towels or washcloths. This may spread the infection.  Change or wash your pillowcase every day.  You should not use eye makeup until the infection is gone.  Do not operate machinery or drive if your vision is blurred.  Stop using contact lenses. Ask your caregiver how to sterilize or replace your contacts before using them again. This depends on the type of contact lenses that you use.  When applying medicine to the infected eye, do not touch the edge of your eyelid with the eyedrop bottle or ointment tube. SEEK IMMEDIATE MEDICAL CARE IF:   Your infection has not improved within 3 days after beginning treatment.  You had yellow discharge from your eye and it returns.  You have increased eye pain.  Your eye redness is spreading.  Your vision becomes blurred.  You have a fever or persistent symptoms for more than 2-3 days.  You have a fever and your symptoms suddenly get worse.  You have facial pain, redness, or swelling. MAKE SURE YOU:   Understand these instructions.  Will watch your condition.  Will get help right away if you  are not doing well or get worse.   This information is not intended to replace advice given to you by your health care provider. Make sure you discuss any questions you have with your health care provider.   Document Released: 03/15/2005 Document Revised: 04/05/2014 Document Reviewed: 08/16/2011 Elsevier Interactive Patient Education [employer]

## 2015-11-16 ENCOUNTER — Emergency Department (HOSPITAL_BASED_OUTPATIENT_CLINIC_OR_DEPARTMENT_OTHER): Payer: Medicaid Other

## 2015-11-16 ENCOUNTER — Emergency Department (HOSPITAL_BASED_OUTPATIENT_CLINIC_OR_DEPARTMENT_OTHER)
Admission: EM | Admit: 2015-11-16 | Discharge: 2015-11-16 | Disposition: A | Discharge status: Home / Self Care | Payer: Medicaid Other | Attending: Emergency Medicine | Admitting: Emergency Medicine

## 2015-11-16 ENCOUNTER — Encounter (HOSPITAL_BASED_OUTPATIENT_CLINIC_OR_DEPARTMENT_OTHER): Payer: Self-pay | Admitting: *Deleted

## 2015-11-16 DIAGNOSIS — Y999 Unspecified external cause status: Secondary | ICD-10-CM | POA: Insufficient documentation

## 2015-11-16 DIAGNOSIS — X501XXA Overexertion from prolonged static or awkward postures, initial encounter: Secondary | ICD-10-CM | POA: Diagnosis not present

## 2015-11-16 DIAGNOSIS — S93401A Sprain of unspecified ligament of right ankle, initial encounter: Secondary | ICD-10-CM | POA: Insufficient documentation

## 2015-11-16 DIAGNOSIS — Z792 Long term (current) use of antibiotics: Secondary | ICD-10-CM | POA: Diagnosis not present

## 2015-11-16 DIAGNOSIS — I10 Essential (primary) hypertension: Secondary | ICD-10-CM | POA: Diagnosis not present

## 2015-11-16 DIAGNOSIS — S99911A Unspecified injury of right ankle, initial encounter: Secondary | ICD-10-CM | POA: Diagnosis present

## 2015-11-16 DIAGNOSIS — Y9389 Activity, other specified: Secondary | ICD-10-CM | POA: Diagnosis not present

## 2015-11-16 DIAGNOSIS — F1290 Cannabis use, unspecified, uncomplicated: Secondary | ICD-10-CM | POA: Insufficient documentation

## 2015-11-16 DIAGNOSIS — Z79899 Other long term (current) drug therapy: Secondary | ICD-10-CM | POA: Insufficient documentation

## 2015-11-16 DIAGNOSIS — F17210 Nicotine dependence, cigarettes, uncomplicated: Secondary | ICD-10-CM | POA: Insufficient documentation

## 2015-11-16 DIAGNOSIS — Y929 Unspecified place or not applicable: Secondary | ICD-10-CM | POA: Insufficient documentation

## 2015-11-16 MED ORDER — IBUPROFEN 600 MG PO TABS: 600.0000 mg | ORAL_TABLET | Freq: Four times a day (QID) | ORAL | 0 refills | Status: DC | PRN

## 2015-11-16 NOTE — ED Provider Notes (Signed)
[facility] Provider Note   CSN: [id number] Arrival date & time: 11/16/15  1131     History   Chief Complaint Chief Complaint  Patient presents with  . Ankle Pain    HPI [name] Whitney is a 30 y.o. female.  Patient is a 30 year old female with no pertinent past medical history presents the ED with complaint of right ankle pain, onset last night. Patient reports she was taking her dog out she stepped in a hole as her dog was pulling her resulting in her falling backwards and sitting on her foot with it in a hyper-plantarflexed position. Denies head injury or LOC. Patient reports having pain to her right ankle with associated swelling. She notes the pain is worse with movement of her right ankle or bearing weight. Denies taking any medications at home were applying ice to affected area.      Past Medical History:  Diagnosis Date  . Bipolar 1 disorder (HCC)   . Depression   . Depression   . Hypertension   . Irritable bowel syndrome (IBS)   . Migraine headache     Patient Active Problem List   Diagnosis Date Noted  . Panic disorder without agoraphobia 06/14/2011  . Adjustment disorder with anxiety 06/08/2011    History reviewed. No pertinent surgical history.  OB History    No data available       Home Medications    Prior to Admission medications   Medication Sig Start Date End Date Taking? Authorizing Provider  pantoprazole (PROTONIX) 20 MG tablet Take 20 mg by mouth daily.   Yes Historical Provider, MD  potassium chloride SA (K-DUR,KLOR-CON) 20 MEQ tablet Take 20 mEq by mouth 2 (two) times daily.   Yes Historical Provider, MD  citalopram (CELEXA) 20 MG tablet Take 20 mg by mouth daily.    Historical Provider, MD  clonazePAM (KLONOPIN) 1 MG tablet Take 1 mg by mouth 3 (three) times daily.    Historical Provider, MD  cloNIDine (CATAPRES) 0.1 MG tablet Take 0.1 mg by mouth 2 (two) times daily.    Historical Provider, MD  ibuprofen (ADVIL,MOTRIN) 600  MG tablet Take 1 tablet (600 mg total) by mouth every 6 (six) hours as needed. 11/16/15   Barrett HenleNicole Elizabeth Jessica Seidman, PA-C  naproxen (NAPROSYN) 500 MG tablet Take 1 tablet (500 mg total) by mouth 2 (two) times daily. 10/30/14   Heather Laisure, PA-C  ofloxacin (OCUFLOX) 0.3 % ophthalmic solution Place 2 drops into both eyes every 4 (four) hours. 04/07/15   Geoffery Lyonsouglas Delo, MD  traMADol (ULTRAM) 50 MG tablet Take 1 tablet (50 mg total) by mouth every 6 (six) hours as needed. 04/07/15   Geoffery Lyonsouglas Delo, MD    Family History No family history on file.  Social History Social History  Substance Use Topics  . Smoking status: Current Every Day Smoker    Packs/day: 0.50    Years: 3.00    Types: Cigarettes  . Smokeless tobacco: Not on file  . Alcohol use Yes     Comment: has tasted alcohol age 30 yrs,, no longer uses     Allergies   Compazine; Promethazine hcl; and Risperidone and related   Review of Systems Review of Systems  Constitutional: Negative for fever.  Musculoskeletal: Positive for arthralgias (right ankle) and joint swelling.  Skin: Negative for wound.  Neurological: Negative for weakness and numbness.     Physical Exam Updated Vital Signs BP 104/69 (BP Location: Right Arm)   Pulse (!) 57  Temp 98.1 F (36.7 C) (Oral)   Resp 16   Ht 5\' 8"  (1.727 m)   Wt 72.6 kg   LMP 10/16/2015 (Approximate)   SpO2 100%   BMI 24.33 kg/m   Physical Exam  Constitutional: She is oriented to person, place, and time. She appears well-developed and well-nourished. No distress.  HENT:  Head: Normocephalic and atraumatic.  Eyes: Conjunctivae and EOM are normal. Right eye exhibits no discharge. Left eye exhibits no discharge. No scleral icterus.  Neck: Normal range of motion. Neck supple.  Cardiovascular: Normal rate and intact distal pulses.   Pulmonary/Chest: Effort normal. No respiratory distress.  Musculoskeletal: Normal range of motion. She exhibits tenderness.       Right ankle: She  exhibits swelling. She exhibits normal range of motion, no ecchymosis, no deformity, no laceration and normal pulse. Tenderness. Lateral malleolus tenderness found. Achilles tendon normal.       Right lower leg: Normal. She exhibits no tenderness, no bony tenderness, no swelling, no edema, no deformity and no laceration.       Right foot: Normal. There is normal range of motion, no tenderness, no bony tenderness, no swelling, normal capillary refill, no crepitus, no deformity and no laceration.       Feet:  Full range of motion of right toes, foot, ankle and knee however patient reports pain in right ankle with full plantar and dorsiflexion of right foot. Mild swelling noted to right lateral and anterior ankle. Sensation grossly intact. 2+ DP pulse. Cap refill less than 2.  Neurological: She is alert and oriented to person, place, and time.  Skin: Skin is warm and dry. Capillary refill takes less than 2 seconds. She is not diaphoretic.  Nursing note and vitals reviewed.    ED Treatments / Results  Labs (all labs ordered are listed, but only abnormal results are displayed) Labs Reviewed - No data to display  EKG  EKG Interpretation None       Radiology Dg Ankle Complete Right  Result Date: 11/16/2015 CLINICAL DATA:  Stepped in hole, twisting ankle last night. EXAM: RIGHT ANKLE - COMPLETE 3+ VIEW COMPARISON:  None. FINDINGS: Lateral soft tissue swelling but no evidence of fracture or dislocation. IMPRESSION: Lateral soft tissue swelling. Electronically Signed   By: Paulina FusiMark  Shogry M.D.   On: 11/16/2015 14:19    Procedures Procedures (including critical care time)  Medications Ordered in ED Medications - No data to display   Initial Impression / Assessment and Plan / ED Course  I have reviewed the triage vital signs and the nursing notes.  Pertinent labs & imaging results that were available during my care of the patient were reviewed by me and considered in my medical decision  making (see chart for details).  Clinical Course    Patient presents with right ankle pain and swelling that occurred after stepping in a hole and being pulled backwards by her dog last night. Denies head injury or LOC. VSS. Exam revealed tenderness to right lateral and anterior ankle with mild swelling noted. Full range of motion of right foot, ankle and knee however patient reports pain and right ankle with range of motion. Right lower shortening or vascular intact. Right ankle x-ray revealed soft tissue swelling. ACE wrap and air cast applied in the ED. plan to discharge patient home with NSAIDs, RICE protocol and symptomatic tx. Advised patient to follow up with PCP as needed. Discussed return precautions with patient.  Final Clinical Impressions(s) / ED Diagnoses  Final diagnoses:  Ankle sprain, right, initial encounter    New Prescriptions New Prescriptions   IBUPROFEN (ADVIL,MOTRIN) 600 MG TABLET    Take 1 tablet (600 mg total) by mouth every 6 (six) hours as needed.       Satira Sark [place], [place] 11/16/15 1611    Glynn Octave, MD 11/16/15 [phone]

## 2015-11-16 NOTE — Discharge Instructions (Signed)
You may take 600 mg ibuprofen every 6 hours as needed for pain relief. I recommend resting, elevating and applying ice to right ankle for 15-20 minutes 3-4 times daily to help with pain and swelling. Continue to wear the Ace wrap and ankle brace on her right ankle for the next 1-2 weeks until your symptoms have improved. Follow-up with your primary care provider in the next 1-2 weeks if her symptoms have not improved. Return to the emergency department if symptoms worsen or new onset of fever, redness, swelling, warmth, numbness, tingling, weakness.

## 2015-11-16 NOTE — ED Triage Notes (Signed)
Pt stepped in hole last night twisting hr right ankle. Pt c/o pain and bruising in same. Pt very somnolent in triage.

## 2016-03-11 ENCOUNTER — Encounter (HOSPITAL_BASED_OUTPATIENT_CLINIC_OR_DEPARTMENT_OTHER): Payer: Self-pay

## 2016-03-11 ENCOUNTER — Emergency Department (HOSPITAL_BASED_OUTPATIENT_CLINIC_OR_DEPARTMENT_OTHER): Payer: Medicaid Other

## 2016-03-11 ENCOUNTER — Emergency Department (HOSPITAL_BASED_OUTPATIENT_CLINIC_OR_DEPARTMENT_OTHER)
Admission: EM | Admit: 2016-03-11 | Discharge: 2016-03-11 | Disposition: A | Discharge status: Home / Self Care | Payer: Medicaid Other | Attending: Emergency Medicine | Admitting: Emergency Medicine

## 2016-03-11 DIAGNOSIS — F17210 Nicotine dependence, cigarettes, uncomplicated: Secondary | ICD-10-CM | POA: Insufficient documentation

## 2016-03-11 DIAGNOSIS — R61 Generalized hyperhidrosis: Secondary | ICD-10-CM | POA: Insufficient documentation

## 2016-03-11 DIAGNOSIS — I10 Essential (primary) hypertension: Secondary | ICD-10-CM | POA: Insufficient documentation

## 2016-03-11 DIAGNOSIS — Z79899 Other long term (current) drug therapy: Secondary | ICD-10-CM | POA: Insufficient documentation

## 2016-03-11 DIAGNOSIS — Z791 Long term (current) use of non-steroidal anti-inflammatories (NSAID): Secondary | ICD-10-CM | POA: Insufficient documentation

## 2016-03-11 DIAGNOSIS — R053 Chronic cough: Secondary | ICD-10-CM

## 2016-03-11 DIAGNOSIS — R05 Cough: Secondary | ICD-10-CM | POA: Insufficient documentation

## 2016-03-11 MED ORDER — AZITHROMYCIN 250 MG PO TABS
500.0000 mg | ORAL_TABLET | Freq: Once | ORAL | Status: AC
  Administered 2016-03-11: 500 mg via ORAL
  Filled 2016-03-11: qty 2

## 2016-03-11 MED ORDER — AZITHROMYCIN 250 MG PO TABS: 250.0000 mg | ORAL_TABLET | Freq: Every day | ORAL | 0 refills | Status: DC

## 2016-03-11 NOTE — Discharge Instructions (Signed)
Take tylenol 2 pills 4 times a day and motrin 4 pills 3 times a day.  Drink plenty of fluids.  Return for worsening shortness of breath, headache, confusion. Follow up with your family doctor.   

## 2016-03-11 NOTE — ED Triage Notes (Addendum)
Pt reports nasal congestion, chest congestion, cough - productive with green/yellow/brown mucus x3 weeks. Left eye conjunctiva pink, irritated.

## 2016-03-11 NOTE — ED Provider Notes (Signed)
[facility]   CSN: [id number] Arrival date & time: 03/11/16  1821   By signing my name below, I, Clovis Pu, attest that this documentation has been prepared under the direction and in the presence of Melene Plan, DO  Electronically Signed: Clovis Pu, ED Scribe. 03/11/16. 7:36 PM.   History   Chief Complaint Chief Complaint  Patient presents with  . Nasal Congestion   The history is provided by the patient. No language interpreter was used.   HPI Comments:  [name] Whitney is a 30 y.o. female, with a hx of HTN, who presents to the Emergency Department complaining of sudden onset, persistent congestion x 4 weeks. Associated symptoms includes productive cough with brown/black sputum, diaphoresis, chills, chest congestion, intermittent eye redness and eye discharge. She has taken Nyquil, Dayquil, tylenol cold and flu with no relief. Pt denies any sick contacts any other associated symptoms and modifying factors at this time.   Past Medical History:  Diagnosis Date  . Bipolar 1 disorder (HCC)   . Depression   . Depression   . Hypertension   . Irritable bowel syndrome (IBS)   . Migraine headache     Patient Active Problem List   Diagnosis Date Noted  . Panic disorder without agoraphobia 06/14/2011  . Adjustment disorder with anxiety 06/08/2011    History reviewed. No pertinent surgical history.  OB History    No data available       Home Medications    Prior to Admission medications   Medication Sig Start Date End Date Taking? Authorizing Provider  cloNIDine (CATAPRES) 0.1 MG tablet Take 0.1 mg by mouth 2 (two) times daily.   Yes Historical Provider, MD  ibuprofen (ADVIL,MOTRIN) 600 MG tablet Take 1 tablet (600 mg total) by mouth every 6 (six) hours as needed. 11/16/15  Yes Barrett Henle, PA-C  naproxen (NAPROSYN) 500 MG tablet Take 1 tablet (500 mg total) by mouth 2 (two) times daily. 10/30/14  Yes Heather Laisure, PA-C    azithromycin (ZITHROMAX) 250 MG tablet Take 1 tablet (250 mg total) by mouth daily. 1 every day until finished. 03/11/16   Melene Plan, DO  citalopram (CELEXA) 20 MG tablet Take 20 mg by mouth daily.    Historical Provider, MD  clonazePAM (KLONOPIN) 1 MG tablet Take 1 mg by mouth 3 (three) times daily.    Historical Provider, MD  ofloxacin (OCUFLOX) 0.3 % ophthalmic solution Place 2 drops into both eyes every 4 (four) hours. 04/07/15   Geoffery Lyons, MD  pantoprazole (PROTONIX) 20 MG tablet Take 20 mg by mouth daily.    Historical Provider, MD  potassium chloride SA (K-DUR,KLOR-CON) 20 MEQ tablet Take 20 mEq by mouth 2 (two) times daily.    Historical Provider, MD  traMADol (ULTRAM) 50 MG tablet Take 1 tablet (50 mg total) by mouth every 6 (six) hours as needed. 04/07/15   Geoffery Lyons, MD    Family History History reviewed. No pertinent family history.  Social History Social History  Substance Use Topics  . Smoking status: Current Every Day Smoker    Packs/day: 0.50    Years: 3.00    Types: Cigarettes  . Smokeless tobacco: Never Used  . Alcohol use Yes     Comment: has tasted alcohol age 26 yrs,, no longer uses     Allergies   Compazine; Promethazine hcl; and Risperidone and related   Review of Systems Review of Systems  Constitutional: Positive for chills and diaphoresis. Negative for  fever.  HENT: Positive for congestion and postnasal drip. Negative for rhinorrhea.   Eyes: Positive for discharge and redness. Negative for visual disturbance.  Respiratory: Positive for cough. Negative for shortness of breath and wheezing.   Cardiovascular: Negative for chest pain and palpitations.  Gastrointestinal: Negative for nausea and vomiting.  Genitourinary: Negative for dysuria and urgency.  Musculoskeletal: Negative for arthralgias and myalgias.  Skin: Negative for pallor and wound.  Neurological: Negative for dizziness and headaches.   Physical Exam Updated Vital Signs BP (!) 138/111  (BP Location: Left Arm)   Pulse 86   Temp 97.9 F (36.6 C) (Oral)   Ht 5\' 9"  (1.753 m)   Wt 170 lb (77.1 kg)   LMP 03/09/2016   SpO2 96%   BMI 25.10 kg/m   Physical Exam  Constitutional: She is oriented to person, place, and time. She appears well-developed and well-nourished. No distress.  HENT:  Head: Normocephalic and atraumatic.  Right Ear: Tympanic membrane normal.  Left Ear: Tympanic membrane normal.  Swollen turbinates, posterior nasal drip, No sinus tenderness.  Eyes: EOM are normal. Pupils are equal, round, and reactive to light.  Neck: Normal range of motion. Neck supple.  Cardiovascular: Normal rate and regular rhythm.  Exam reveals no gallop and no friction rub.   No murmur heard. Pulmonary/Chest: Effort normal. She has no wheezes. She has no rales.  Abdominal: Soft. She exhibits no distension. There is no tenderness.  Musculoskeletal: She exhibits no edema or tenderness.  Neurological: She is alert and oriented to person, place, and time.  Skin: Skin is warm and dry. She is not diaphoretic.  Psychiatric: She has a normal mood and affect. Her behavior is normal.  Nursing note and vitals reviewed.   ED Treatments / Results  DIAGNOSTIC STUDIES:  Oxygen Saturation is 96% on RA, normal by my interpretation.    COORDINATION OF CARE:  7:34 PM Discussed treatment plan with pt at bedside and pt agreed to plan.  Labs (all labs ordered are listed, but only abnormal results are displayed) Labs Reviewed - No data to display  EKG  EKG Interpretation None       Radiology Dg Chest 2 View  Result Date: 03/11/2016 CLINICAL DATA:  Nasal congestion, cough for 3 weeks. EXAM: CHEST  2 VIEW COMPARISON:  CT chest April 20, 2015 FINDINGS: Cardiomediastinal silhouette is normal. No pleural effusions or focal consolidations. Trachea projects midline and there is no pneumothorax. Soft tissue planes and included osseous structures are non-suspicious. IMPRESSION: Normal chest.  Electronically Signed   By: Awilda Metroourtnay  Bloomer M.D.   On: 03/11/2016 18:57    Procedures Procedures (including critical care time)  Medications Ordered in ED Medications  azithromycin (ZITHROMAX) tablet 500 mg (500 mg Oral Given 03/11/16 2006)     Initial Impression / Assessment and Plan / ED Course  I have reviewed the triage vital signs and the nursing notes.  Pertinent labs & imaging results that were available during my care of the patient were reviewed by me and considered in my medical decision making (see chart for details).  Clinical Course     30 yo F With a chief complaint of cough and nasal congestion. Going on for the past 4 weeks. Chest x-ray is negative for pneumonia. Clear lungs on my exam. Concern for possible pertussis with prolonged symptoms.  10:27 PM:  I have discussed the diagnosis/risks/treatment options with the patient and family and believe the pt to be eligible for discharge home to follow-up with  PCP. We also discussed returning to the ED immediately if new or worsening sx occur. We discussed the sx which are most concerning (e.g., sudden worsening pain, fever, inability to tolerate by mouth) that necessitate immediate return. Medications administered to the patient during their visit and any new prescriptions provided to the patient are listed below.  Medications given during this visit Medications  azithromycin (ZITHROMAX) tablet 500 mg (500 mg Oral Given 03/11/16 2006)     The patient appears reasonably screen and/or stabilized for discharge and I doubt any other medical condition or other [facility] requiring further screening, evaluation, or treatment in the ED at this time prior to discharge.    Final Clinical Impressions(s) / ED Diagnoses   Final diagnoses:  Persistent cough    New Prescriptions Discharge Medication List as of 03/11/2016  7:47 PM    START taking these medications   Details  azithromycin (ZITHROMAX) 250 MG tablet Take 1 tablet (250  mg total) by mouth daily. 1 every day until finished., Starting Thu 03/11/2016, Print       I personally performed the services described in this documentation, which was scribed in my presence. The recorded information has been reviewed and is accurate.     Melene Planan Constantino Starace, DO 03/11/16 2227

## 2016-03-12 MED FILL — AZITHROMYCIN 250 MG TABLET: 250 | 4 days supply | Qty: 4 | Fill #0

## 2016-07-23 ENCOUNTER — Encounter: Payer: Self-pay | Admitting: Family Medicine

## 2016-07-23 ENCOUNTER — Ambulatory Visit (INDEPENDENT_AMBULATORY_CARE_PROVIDER_SITE_OTHER): Payer: BLUE CROSS/BLUE SHIELD | Admitting: Family Medicine

## 2016-07-23 VITALS — BP 130/87 | HR 64 | Temp 98.7°F | Resp 18 | Ht 67.13 in | Wt 176.8 lb

## 2016-07-23 DIAGNOSIS — I10 Essential (primary) hypertension: Secondary | ICD-10-CM

## 2016-07-23 DIAGNOSIS — Z3009 Encounter for other general counseling and advice on contraception: Secondary | ICD-10-CM | POA: Diagnosis not present

## 2016-07-23 DIAGNOSIS — F331 Major depressive disorder, recurrent, moderate: Secondary | ICD-10-CM | POA: Diagnosis not present

## 2016-07-23 DIAGNOSIS — F410 Panic disorder [episodic paroxysmal anxiety] without agoraphobia: Secondary | ICD-10-CM | POA: Diagnosis not present

## 2016-07-23 DIAGNOSIS — I701 Atherosclerosis of renal artery: Secondary | ICD-10-CM

## 2016-07-23 MED ORDER — CLONIDINE HCL 0.1 MG PO TABS: 0.1000 mg | ORAL_TABLET | Freq: Two times a day (BID) | ORAL | 2 refills | Status: AC

## 2016-07-23 MED ORDER — ALPRAZOLAM 0.5 MG PO TABS: 0.5000 mg | ORAL_TABLET | Freq: Two times a day (BID) | ORAL | 1 refills | Status: AC | PRN

## 2016-07-23 MED ORDER — CITALOPRAM HYDROBROMIDE 20 MG PO TABS: ORAL_TABLET | ORAL | 0 refills | Status: DC

## 2016-07-23 NOTE — Patient Instructions (Signed)
Managing Your Hypertension Hypertension is commonly called high blood pressure. This is when the force of your blood pressing against the walls of your arteries is too strong. Arteries are blood vessels that carry blood from your heart throughout your body. Hypertension forces the heart to work harder to pump blood, and may cause the arteries to become narrow or stiff. Having untreated or uncontrolled hypertension can cause heart attack, stroke, kidney disease, and other problems. What are blood pressure readings? A blood pressure reading consists of a higher number over a lower number. Ideally, your blood pressure should be below 120/80. The first ("top") number is called the systolic pressure. It is a measure of the pressure in your arteries as your heart beats. The second ("bottom") number is called the diastolic pressure. It is a measure of the pressure in your arteries as the heart relaxes. What does my blood pressure reading mean? Blood pressure is classified into four stages. Based on your blood pressure reading, your health care provider may use the following stages to determine what type of treatment you need, if any. Systolic pressure and diastolic pressure are measured in a unit called mm Hg. Normal   Systolic pressure: below 120.  Diastolic pressure: below 80. Elevated   Systolic pressure: 120-129.  Diastolic pressure: below 80. Hypertension stage 1     Diastolic pressure: 80-89. Hypertension stage 2   Systolic pressure: 140 or above.  Diastolic pressure: 90 or above. What health risks are associated with hypertension? Managing your hypertension is an important responsibility. Uncontrolled hypertension can lead to:  A heart attack.  A stroke.  A weakened blood vessel (aneurysm).  Heart failure.  Kidney damage.  Eye damage.  Metabolic syndrome.  Memory and concentration problems. What changes can I make to manage my hypertension? Eating and drinking   Eat a  diet that is high in fiber and potassium, and low in salt (sodium), added sugar, and fat. An example eating plan is called the DASH (Dietary Approaches to Stop Hypertension) diet. To eat this way:  Eat plenty of fresh fruits and vegetables. Try to fill half of your plate at each meal with fruits and vegetables.  Eat whole grains, such as whole wheat pasta, brown rice, or whole grain bread. Fill about one quarter of your plate with whole grains.  Eat low-fat diary products.  Avoid fatty cuts of meat, processed or cured meats, and poultry with skin. Fill about one quarter of your plate with lean proteins such as fish, chicken without skin, beans, eggs, and tofu.  Avoid premade and processed foods. These tend to be higher in sodium, added sugar, and fat.     Lifestyle   Work with your health care provider to maintain a healthy body weight, or to lose weight. Ask what an ideal weight is for you.  Get at least 30 minutes of exercise that causes your heart to beat faster (aerobic exercise) most days of the week. Activities may include walking, swimming, or biking.       Monitoring   Monitor your blood pressure at home as told by your health care provider. Your personal target blood pressure may vary depending on your medical conditions, your age, and other factors.  Have your blood pressure checked regularly, as often as told by your health care provider. Working with your health care provider   Review all the medicines you take with your health care provider because there may be side effects or interactions.  Talk with your health   care provider about your diet, exercise habits, and other lifestyle factors that may be contributing to hypertension.  Visit your health care provider regularly. Your health care provider can help you create and adjust your plan for managing hypertension. Will I need medicine to control my blood pressure? Your health care provider may prescribe medicine if  lifestyle changes are not enough to get your blood pressure under control, and if:  Your systolic blood pressure is 130 or higher.  Your diastolic blood pressure is 80 or higher. Take medicines only as told by your health care provider. Follow the directions carefully. Blood pressure medicines must be taken as prescribed. The medicine does not work as well when you skip doses. Skipping doses also puts you at risk for problems. Contact a health care provider if:  You think you are having a reaction to medicines you have taken.  You have repeated (recurrent) headaches.  You feel dizzy.  You have swelling in your ankles.  You have trouble with your vision. Get help right away if:  You develop a severe headache or confusion.  You have unusual weakness or numbness, or you feel faint.  You have severe pain in your chest or abdomen.  You vomit repeatedly.  You have trouble breathing. Summary  Hypertension is when the force of blood pumping through your arteries is too strong. If this condition is not controlled, it may put you at risk for serious complications.  Your personal target blood pressure may vary depending on your medical conditions, your age, and other factors. For most people, a normal blood pressure is less than 120/80.  Hypertension is managed by lifestyle changes, medicines, or both. Lifestyle changes include weight loss, eating a healthy, low-sodium diet, exercising more, and limiting alcohol. This information is not intended to replace advice given to you by your health care provider. Make sure you discuss any questions you have with your health care provider. Document Released: 12/08/2011 Document Revised: 02/11/2016 Document Reviewed: 02/11/2016 Elsevier Interactive Patient Education  2017 [employer]  Renal Artery Stenosis Renal artery stenosis (RAS) is narrowing of the artery that carries blood to your kidneys. It can affect one or both kidneys. Your kidneys  filter waste and extra fluid from your blood. You get rid of the waste and fluid when you urinate. Your kidneys also make an important chemical messenger (hormone) called renin. Renin helps regulate your blood pressure. The first sign of RAS may be high blood pressure. Over time, other symptoms can develop. What are the causes? Plaque buildup in your arteries (atherosclerosis) is the main cause of RAS. The plaques that cause this are made up of:  Fat.  Cholesterol.  Calcium.  Other substances. As these substances build up in your renal artery, this slows the blood supply to your kidneys. The lack of blood and oxygen causes the signs and symptoms of RAS. A much less common cause of RAS is a disease called fibromuscular dysplasia. This disease causes abnormal cell growth that narrows the renal artery. It is not related to atherosclerosis. It occurs mostly in women who are 8-2 years old. It may be passed down through families. What increases the risk? You may be at risk for renal artery stenosis if you:  Are a man who is at least 31 years old.  Are a woman who is at least 31 years old.  Have high blood pressure.  Have high cholesterol.  Are a smoker.  Abuse alcohol.  Have diabetes or prediabetes.  Are  overweight.  Have a family history of early heart disease. What are the signs or symptoms? RAS usually develops slowly. You may not have any signs or symptoms at first. The earliest signs may be:  Developing high blood pressure.  A sudden increase in existing high blood pressure.  No longer responding to medicine that used to control your blood pressure. Later signs and symptoms are due to kidney damage. They may include:  Fatigue.  Shortness of breath.  Swollen legs and feet.  Dry skin.  Headaches.  Muscle cramps.  Loss of appetite.  Nausea or vomiting. How is this diagnosed? Your health care provider may suspect RAS based on changes in your blood pressure and  your risk factors. A physical exam will be done. Your health care provider may use a stethoscope to listen for a whooshing sound (bruit) that can occur where the renal artery is blocking blood flow. Several tests may be done to confirm a diagnosis of RAS. These may include:  Blood and urine tests to check your kidney function.  Imaging tests of your kidneys, such as:  A test that involves using sound waves to create an image of your kidneys and the blood flow to your kidneys (ultrasound).  A test in which dye is injected into one of your blood vessels so images can be taken as the dye flows through your renal arteries (angiogram). These tests can be done using X-rays, a CT scan (computed tomography angiogram, CTA), or a type of MRI (magnetic resonance angiogram, MRA). How is this treated? Making lifestyle changes to reduce your risk factors is the first treatment option for early RAS. If the blood flow to one of your kidneys is cut by more than half, you may need medicine to:  Lower your blood pressure. This is the main medical treatment for RAS. You may need more than one type of medicine for this. The two types that work best for RAS are:  ACE inhibitors.  Angiotensin receptor blockers.  Reduce fluid in the body (diuretics).  Lower your cholesterol (statins). If medicine is not enough to control RAS, you may need surgery. This may involve:  Threading a tube with an inflatable balloon into the renal artery to force it open (angioplasty).  Removing plaque from inside the artery (endarterectomy). Follow these instructions at home:  Take medicines only as directed by your health care provider.  Make any lifestyle changes recommended by your health care provider. This may include:  Working with a dietitian to maintain a heart-healthy diet. This type of diet is low in saturated fat, salt, and added sugar.  Starting an exercise program as directed by your health care  provider.  Maintaining a healthy weight.  Quitting smoking.  Not abusing alcohol.  Keep all follow-up visits as directed by your health care provider. This is important. Contact a health care provider if:  Your symptoms of RAS are not getting better.  Your symptoms are changing or getting worse. Get help right away if:  You have very bad pain in your back or abdomen.  You have blood in your urine. This information is not intended to replace advice given to you by your health care provider. Make sure you discuss any questions you have with your health care provider. Document Released: 12/09/2004 Document Revised: 08/21/2015 Document Reviewed: 06/28/2013 Elsevier Interactive Patient Education  2017 [employer].

## 2016-07-23 NOTE — Progress Notes (Signed)
Subjective:  By signing my name below, I, Essence Howell, attest that this documentation has been prepared under the direction and in the presence of Norberto Sorenson, MD Electronically Signed: Charline Bills, ED Scribe 07/23/2016 at 1:52 PM.   Patient ID: Kennon Holter, female    DOB: 1985-07-27, 31 y.o.   MRN: [id number]  Chief Complaint  Patient presents with  . Establish Care   HPI KIMONI PICKERILL is a 31 y.o. female who presents to Primary Care at [facility] to establish care.  Pt has been seen by provider Judeth Cornfield with [facility] in the past. Pt states that she has been off of Celexa and Klonopin for 2 months now. States she has been struggling with depression and anxiety since her mother passed 2 years ago. She has tried Xanax and Lorazepam in the past but states medications didn't help panic attacks, concentration or "racing thoughts". Pt works as a Lawyer at [employer]. She has tried Prozac as well but states it was "too strong". Pt reports that her father has a h/o depression and panic attacks. Sates her mother has seen a psychiatrist as well. Pt has seen a psychiatrist Dr. Radonna Ricker and was started on Seroquel and Lithium but states the medication "had her off the wall". She states Dr. Curly Shores diagnosed her bipolar disorder and manic depression but she denies a h/o manic behaviors. She also denies a h/o illicit drug use. Pt denies SI. She is currently living with her father.   HTN Pt was diagnosed with HTN several years ago via [place]. She was prescribed clonidine twice daily for HTN but stopped the medication when she lost her insurance. She denies any side effects since stopping the medications. Pt checks her BP outside of the office with readings of 168/98. Triage BP: 130/97. Pt reports occasional migraine HAs that are accompanied with elevated BP 1-2 days/week which she treats with 800 mg ibuprofen and rest. Pt is not currently on BC thought she does report sexual activity.  She has tried Yasmin and Depo in the past with side effects of weight gain.   Past Medical History:  Diagnosis Date  . Bipolar 1 disorder (HCC)   . Depression   . Depression   . Hypertension   . Irritable bowel syndrome (IBS)   . Migraine headache    Current Outpatient Prescriptions on File Prior to Visit  Medication Sig Dispense Refill  . azithromycin (ZITHROMAX) 250 MG tablet Take 1 tablet (250 mg total) by mouth daily. 1 every day until finished. (Patient not taking: Reported on 07/23/2016) 4 tablet 0  . citalopram (CELEXA) 20 MG tablet Take 20 mg by mouth daily.    . clonazePAM (KLONOPIN) 1 MG tablet Take 1 mg by mouth 3 (three) times daily.    . cloNIDine (CATAPRES) 0.1 MG tablet Take 0.1 mg by mouth 2 (two) times daily.    Marland Kitchen ibuprofen (ADVIL,MOTRIN) 600 MG tablet Take 1 tablet (600 mg total) by mouth every 6 (six) hours as needed. (Patient not taking: Reported on 07/23/2016) 30 tablet 0  . naproxen (NAPROSYN) 500 MG tablet Take 1 tablet (500 mg total) by mouth 2 (two) times daily. (Patient not taking: Reported on 07/23/2016) 30 tablet 0  . ofloxacin (OCUFLOX) 0.3 % ophthalmic solution Place 2 drops into both eyes every 4 (four) hours. (Patient not taking: Reported on 07/23/2016) 10 mL 0  . pantoprazole (PROTONIX) 20 MG tablet Take 20 mg by mouth daily.    . potassium  chloride SA (K-DUR,KLOR-CON) 20 MEQ tablet Take 20 mEq by mouth 2 (two) times daily.    . traMADol (ULTRAM) 50 MG tablet Take 1 tablet (50 mg total) by mouth every 6 (six) hours as needed. (Patient not taking: Reported on 07/23/2016) 12 tablet 0  . [DISCONTINUED] FLUoxetine (PROZAC) 40 MG capsule Take 80 mg by mouth daily.     . [DISCONTINUED] lithium carbonate (LITHOBID) 300 MG CR tablet Take 300 mg by mouth daily.      No current facility-administered medications on file prior to visit.    Allergies  Allergen Reactions  . Compazine Other (See Comments)    Muscle spasms  . Promethazine Hcl Other (See Comments)     Muscle spasms  . Risperidone And Related Hives   Review of Systems  Psychiatric/Behavioral: Negative for self-injury and suicidal ideas.      Objective:   Physical Exam  Constitutional: She is oriented to person, place, and time. She appears well-developed and well-nourished. No distress.  HENT:  Head: Normocephalic and atraumatic.  Eyes: Conjunctivae and EOM are normal.  Neck: Neck supple. No tracheal deviation present.  Cardiovascular: Normal rate.   Pulmonary/Chest: Effort normal. No respiratory distress.  Musculoskeletal: Normal range of motion.  Neurological: She is alert and oriented to person, place, and time.  Skin: Skin is warm and dry.  Psychiatric: She has a normal mood and affect. Her behavior is normal.  Nursing note and vitals reviewed.   BP 130/87   Pulse 64   Temp 98.7 F (37.1 C) (Oral)   Resp 18   Ht 5' 7.13" (1.705 m)   Wt 176 lb 12.8 oz (80.2 kg)   LMP 06/27/2016 (Approximate)   SpO2 100%   BMI 27.59 kg/m     Assessment & Plan:   1. Essential hypertension -Restart clonidine 0.1 twice a day. Has failed other antihypertensives prior but unknown what. Is aware of concerns for rebound hypertension with missed dosing and patient feels confident in her ability to comply. Continue to check blood pressure outside office.   2. Moderate episode of recurrent major depressive disorder (HCC) - Restart citalopram. Failed Prozac prior. Patient denies any history of manic symptoms and has tolerated citalopram quite well prior.   3. Panic disorder without agoraphobia - reports doing better on alprazolam then lorazepam or clonazepam. Reviewed concerns for addiction, dependence, tolerance, unintentional OD. Patient would like to restart alprazolam and does not anticipate any need for increasing doses in foreseeable future. Will use as needed but needs regularly at least once to twice a day. Reviewed basics of controlled substance agreement with patient, she is aware of no other  controlled substances from other providers. If acute injury to alert [place]. Needs to be seen in office for any refills.   4. Family planning - reports did well on OCPs prior but gained weight with Depo-Provera. Restart antihypertensives and ensure migraine headaches resolved. Reevaluate at follow-up and likely start OCPs then. May be due for Pap.   5. Renal artery stenosis (HCC) - patient reports diagnosed with ultrasound by Dr. Arnoldo Lenis resulting in diastolic hypertension.    Recheck in 2 months.  Meds ordered this encounter  Medications  . citalopram (CELEXA) 20 MG tablet    Sig: Use 1/2 tab po qd x 2 wks, then 1 tab po qd    Dispense:  90 tablet    Refill:  0  . cloNIDine (CATAPRES) 0.1 MG tablet    Sig: Take 1 tablet (0.1 mg total) by  mouth 2 (two) times daily.    Dispense:  60 tablet    Refill:  2  . ALPRAZolam (XANAX) 0.5 MG tablet    Sig: Take 1 tablet (0.5 mg total) by mouth 2 (two) times daily as needed for anxiety.    Dispense:  60 tablet    Refill:  1    I personally performed the services described in this documentation, which was scribed in my presence. The recorded information has been reviewed and considered, and addended by me as needed.   Norberto Sorenson, M.D.  Primary Care at [facility] [place] [address] [place], [place] [address] [phone] phone [phone] fax  07/23/16 2:37 PM

## 2016-11-07 ENCOUNTER — Emergency Department (HOSPITAL_BASED_OUTPATIENT_CLINIC_OR_DEPARTMENT_OTHER): Payer: BLUE CROSS/BLUE SHIELD

## 2016-11-07 ENCOUNTER — Emergency Department (HOSPITAL_BASED_OUTPATIENT_CLINIC_OR_DEPARTMENT_OTHER)
Admission: EM | Admit: 2016-11-07 | Discharge: 2016-11-07 | Disposition: A | Discharge status: Home / Self Care | Payer: BLUE CROSS/BLUE SHIELD | Attending: Emergency Medicine | Admitting: Emergency Medicine

## 2016-11-07 ENCOUNTER — Encounter (HOSPITAL_BASED_OUTPATIENT_CLINIC_OR_DEPARTMENT_OTHER): Payer: Self-pay | Admitting: Emergency Medicine

## 2016-11-07 DIAGNOSIS — R05 Cough: Secondary | ICD-10-CM

## 2016-11-07 DIAGNOSIS — R5383 Other fatigue: Secondary | ICD-10-CM | POA: Diagnosis not present

## 2016-11-07 DIAGNOSIS — F17210 Nicotine dependence, cigarettes, uncomplicated: Secondary | ICD-10-CM | POA: Insufficient documentation

## 2016-11-07 DIAGNOSIS — I10 Essential (primary) hypertension: Secondary | ICD-10-CM | POA: Insufficient documentation

## 2016-11-07 DIAGNOSIS — J3489 Other specified disorders of nose and nasal sinuses: Secondary | ICD-10-CM | POA: Insufficient documentation

## 2016-11-07 DIAGNOSIS — Z79899 Other long term (current) drug therapy: Secondary | ICD-10-CM | POA: Diagnosis not present

## 2016-11-07 DIAGNOSIS — R053 Chronic cough: Secondary | ICD-10-CM

## 2016-11-07 NOTE — ED Provider Notes (Signed)
[facility] Provider Note   CSN: [id number] Arrival date & time: 11/07/16  1705  By signing my name below, I, Diona BrownerJennifer Gorman, attest that this documentation has been prepared under the direction and in the presence of Cathren LaineSteinl, Yosef Krogh, MD. Electronically Signed: Diona BrownerJennifer Gorman, ED Scribe. 11/07/16. 7:14 PM.  History   Chief Complaint Chief Complaint  Patient presents with  . Cough    HPI [name] Whitney is a 31 y.o. female who presents to the Emergency Department complaining of a productive cough for the last month. Associated sx include appetite change, chills, fatigue, rhinorrhea, diaphoresis, and sneezing. Pt works as a LawyerCNA and reports that she has been in direct contact with 4 people who have had pneumonia. Pt has tried Mucinex with relief and zyrtec with mild to no relief. She tried to see her PCP, but was unable to get an appointment. No hx of allergies. Pt denies fever, CP, bilateral leg edema, abdominal pain or any other complaints at this time.   PCP: Sherren MochaShaw, Eva N, MD  The history is provided by the patient. No language interpreter was used.    Past Medical History:  Diagnosis Date  . Bipolar 1 disorder (HCC)   . Depression   . Depression   . Hypertension    due to renal artery stenosis diagnosed by US when young per pt  . Irritable bowel syndrome (IBS)   . Migraine headache     Patient Active Problem List   Diagnosis Date Noted  . Panic disorder without agoraphobia 06/14/2011  . Adjustment disorder with anxiety 06/08/2011    History reviewed. No pertinent surgical history.  OB History    No data available       Home Medications    Prior to Admission medications   Medication Sig Start Date End Date Taking? Authorizing Provider  ALPRAZolam Prudy Feeler(XANAX) 0.5 MG tablet Take 1 tablet (0.5 mg total) by mouth 2 (two) times daily as needed for anxiety. 07/23/16   Sherren MochaShaw, Eva N, MD  azithromycin (ZITHROMAX) 250 MG tablet Take 1 tablet (250 mg total) by mouth  daily. 1 every day until finished. Patient not taking: Reported on 07/23/2016 03/11/16   Melene PlanFloyd, Dan, DO  citalopram (CELEXA) 20 MG tablet Use 1/2 tab po qd x 2 wks, then 1 tab po qd 07/23/16   Sherren MochaShaw, Eva N, MD  clonazePAM (KLONOPIN) 1 MG tablet Take 1 mg by mouth 3 (three) times daily.    [provider]  cloNIDine (CATAPRES) 0.1 MG tablet Take 1 tablet (0.1 mg total) by mouth 2 (two) times daily. 07/23/16   Sherren MochaShaw, Eva N, MD  pantoprazole (PROTONIX) 20 MG tablet Take 20 mg by mouth daily.    [provider]    Family History History reviewed. No pertinent family history.  Social History Social History  Substance Use Topics  . Smoking status: Current Every Day Smoker    Packs/day: 0.50    Years: 3.00    Types: Cigarettes  . Smokeless tobacco: Never Used  . Alcohol use Yes     Comment: has tasted alcohol age 31 yrs,, no longer uses     Allergies   Cleocin [clindamycin hcl]; Compazine; Promethazine hcl; and Risperidone and related   Review of Systems Review of Systems  Constitutional: Positive for appetite change, chills, diaphoresis and fatigue. Negative for fever.  HENT: Positive for rhinorrhea and sneezing.   Respiratory: Positive for cough.   Cardiovascular: Negative for chest pain and leg swelling.  Gastrointestinal: Negative  for abdominal pain.  Skin: Negative for rash.     Physical Exam Updated Vital Signs BP (!) 150/95 (BP Location: Left Arm)   Pulse (!) 106   Temp 98.5 F (36.9 C) (Oral)   Resp 18   Ht 5\' 8"  (1.727 m)   Wt 168 lb (76.2 kg)   LMP 10/27/2016   SpO2 100%   BMI 25.54 kg/m   Physical Exam  Constitutional: She is oriented to person, place, and time. She appears well-developed and well-nourished.  HENT:  Head: Normocephalic.  Eyes: EOM are normal.  Neck: Normal range of motion.  Pulmonary/Chest: Effort normal.  Abdominal: She exhibits no distension.  Musculoskeletal: Normal range of motion.  Neurological: She is alert and  oriented to person, place, and time.  Psychiatric: She has a normal mood and affect.  Nursing note and vitals reviewed.    ED Treatments / Results  DIAGNOSTIC STUDIES: Oxygen Saturation is 100% on RA, normal by my interpretation.   COORDINATION OF CARE: 7:14 PM-Discussed next steps with pt. Pt verbalized understanding and is agreeable with the plan.   Labs (all labs ordered are listed, but only abnormal results are displayed) Labs Reviewed - No data to display  EKG  EKG Interpretation None       Radiology Dg Chest 2 View  Result Date: 11/07/2016 CLINICAL DATA:  Initial evaluation for acute cough for 1 month. EXAM: CHEST  2 VIEW COMPARISON:  Prior radiograph from 03/11/2016. FINDINGS: The cardiac and mediastinal silhouettes are stable in size and contour, and remain within normal limits. The lungs are normally inflated. No airspace consolidation, pleural effusion, or pulmonary edema is identified. There is no pneumothorax. No acute osseous abnormality identified. IMPRESSION: No active cardiopulmonary disease. Electronically Signed   By: Rise Mu M.D.   On: 11/07/2016 19:27    Procedures Procedures (including critical care time)  Medications Ordered in ED Medications - No data to display   Initial Impression / Assessment and Plan / ED Course  I have reviewed the triage vital signs and the nursing notes.  Pertinent labs & imaging results that were available during my care of the patient were reviewed by me and considered in my medical decision making (see chart for details).  Cxr.    MDM  Patient breathing comfortably in ED. No distress.   Pulse ox 100%.  cxr negative.    Final Clinical Impressions(s) / ED Diagnoses   Final diagnoses:  None    New Prescriptions New Prescriptions   No medications on file   I personally performed the services described in this documentation, which was scribed in my presence. The recorded information has been  reviewed and considered. Cathren Laine, MD     Cathren Laine, MD 11/07/16 [phone]

## 2016-11-07 NOTE — Discharge Instructions (Signed)
It was our pleasure to provide your ER care today - we hope that you feel better.  Your chest xray was read by our radiologist a being good/clear - no sign of pneumonia.  You may take robitussin, mucinex, or triaminic as need for cough.  Avoid any smoking.  Follow up with primary care doctor in the next 1-2 weeks if symptoms fail to improve/resolve.

## 2016-11-07 NOTE — ED Triage Notes (Signed)
Patient states that she had dental work done about a month ago. Since the patient has had a chronic cough. The patient is horse in voice. Reports that she is also having increased pain in her abdominal area

## 2016-12-03 ENCOUNTER — Emergency Department (HOSPITAL_BASED_OUTPATIENT_CLINIC_OR_DEPARTMENT_OTHER)
Admission: EM | Admit: 2016-12-03 | Discharge: 2016-12-03 | Disposition: A | Discharge status: Home / Self Care | Payer: BLUE CROSS/BLUE SHIELD | Attending: Emergency Medicine | Admitting: Emergency Medicine

## 2016-12-03 ENCOUNTER — Emergency Department (HOSPITAL_BASED_OUTPATIENT_CLINIC_OR_DEPARTMENT_OTHER): Payer: BLUE CROSS/BLUE SHIELD

## 2016-12-03 ENCOUNTER — Encounter (HOSPITAL_BASED_OUTPATIENT_CLINIC_OR_DEPARTMENT_OTHER): Payer: Self-pay | Admitting: *Deleted

## 2016-12-03 DIAGNOSIS — F17210 Nicotine dependence, cigarettes, uncomplicated: Secondary | ICD-10-CM | POA: Diagnosis not present

## 2016-12-03 DIAGNOSIS — Z79899 Other long term (current) drug therapy: Secondary | ICD-10-CM | POA: Insufficient documentation

## 2016-12-03 DIAGNOSIS — N760 Acute vaginitis: Secondary | ICD-10-CM | POA: Diagnosis not present

## 2016-12-03 DIAGNOSIS — R109 Unspecified abdominal pain: Secondary | ICD-10-CM | POA: Diagnosis present

## 2016-12-03 DIAGNOSIS — I10 Essential (primary) hypertension: Secondary | ICD-10-CM | POA: Diagnosis not present

## 2016-12-03 DIAGNOSIS — A599 Trichomoniasis, unspecified: Secondary | ICD-10-CM | POA: Diagnosis not present

## 2016-12-03 DIAGNOSIS — B9689 Other specified bacterial agents as the cause of diseases classified elsewhere: Secondary | ICD-10-CM

## 2016-12-03 DIAGNOSIS — N342 Other urethritis: Secondary | ICD-10-CM

## 2016-12-03 LAB — URINALYSIS, MICROSCOPIC (REFLEX): RBC / HPF: NONE SEEN RBC/hpf (ref 0–5)

## 2016-12-03 LAB — URINALYSIS, ROUTINE W REFLEX MICROSCOPIC
Bilirubin Urine: NEGATIVE
Glucose, UA: NEGATIVE mg/dL
HGB URINE DIPSTICK: NEGATIVE
Ketones, ur: NEGATIVE mg/dL
Nitrite: NEGATIVE
PROTEIN: NEGATIVE mg/dL
SPECIFIC GRAVITY, URINE: 1.01 (ref 1.005–1.030)
pH: 6 (ref 5.0–8.0)

## 2016-12-03 LAB — COMPREHENSIVE METABOLIC PANEL
ALBUMIN: 4.1 g/dL (ref 3.5–5.0)
ALT: 16 U/L (ref 14–54)
ANION GAP: 8 (ref 5–15)
AST: 28 U/L (ref 15–41)
Alkaline Phosphatase: 45 U/L (ref 38–126)
BILIRUBIN TOTAL: 0.7 mg/dL (ref 0.3–1.2)
BUN: 15 mg/dL (ref 6–20)
CO2: 21 mmol/L — ABNORMAL LOW (ref 22–32)
Calcium: 8.3 mg/dL — ABNORMAL LOW (ref 8.9–10.3)
Chloride: 109 mmol/L (ref 101–111)
Creatinine, Ser: 0.84 mg/dL (ref 0.44–1.00)
GFR calc non Af Amer: >60 mL/min (ref >60)
GLUCOSE: 72 mg/dL (ref 65–99)
POTASSIUM: 4.1 mmol/L (ref 3.5–5.1)
SODIUM: 138 mmol/L (ref 135–145)
TOTAL PROTEIN: 7.5 g/dL (ref 6.5–8.1)

## 2016-12-03 LAB — CBC WITH DIFFERENTIAL/PLATELET
BASOS PCT: 0 %
Basophils Absolute: 0 10*3/uL (ref 0.0–0.1)
EOS ABS: 0.2 10*3/uL (ref 0.0–0.7)
EOS PCT: 3 %
HCT: 35.5 % — ABNORMAL LOW (ref 36.0–46.0)
HEMOGLOBIN: 11.7 g/dL — AB (ref 12.0–15.0)
LYMPHS ABS: 1.9 10*3/uL (ref 0.7–4.0)
Lymphocytes Relative: 25 %
MCH: 31.1 pg (ref 26.0–34.0)
MCHC: 33 g/dL (ref 30.0–36.0)
MCV: 94.4 fL (ref 78.0–100.0)
MONO ABS: 0.7 10*3/uL (ref 0.1–1.0)
MONOS PCT: 10 %
Neutro Abs: 4.7 10*3/uL (ref 1.7–7.7)
Neutrophils Relative %: 62 %
PLATELETS: 213 10*3/uL (ref 150–400)
RBC: 3.76 MIL/uL — ABNORMAL LOW (ref 3.87–5.11)
RDW: 14 % (ref 11.5–15.5)
WBC: 7.6 10*3/uL (ref 4.0–10.5)

## 2016-12-03 LAB — WET PREP, GENITAL
Sperm: NONE SEEN
Yeast Wet Prep HPF POC: NONE SEEN

## 2016-12-03 LAB — PREGNANCY, URINE: PREG TEST UR: NEGATIVE

## 2016-12-03 MED ORDER — DOXYCYCLINE HYCLATE 100 MG PO TABS
100.0000 mg | ORAL_TABLET | Freq: Once | ORAL | Status: AC
Start: 2016-12-03 — End: 2016-12-03
  Administered 2016-12-03: 100 mg via ORAL
  Filled 2016-12-03: qty 1

## 2016-12-03 MED ORDER — HYDROCODONE-ACETAMINOPHEN 5-325 MG PO TABS
1.0000 | ORAL_TABLET | Freq: Four times a day (QID) | ORAL | 0 refills | Status: DC | PRN
Start: 2016-12-03 — End: 2018-09-23

## 2016-12-03 MED ORDER — SODIUM CHLORIDE 0.9 % IV BOLUS (SEPSIS)
1000.0000 mL | Freq: Once | INTRAVENOUS | Status: AC
  Administered 2016-12-03: 1000 mL via INTRAVENOUS

## 2016-12-03 MED ORDER — METRONIDAZOLE 500 MG PO TABS
500.0000 mg | ORAL_TABLET | Freq: Two times a day (BID) | ORAL | 0 refills | Status: DC
Start: 2016-12-03 — End: 2018-10-11

## 2016-12-03 MED ORDER — DEXTROSE 5 % IV SOLN
1.0000 g | Freq: Once | INTRAVENOUS | Status: AC
  Administered 2016-12-03: 1 g via INTRAVENOUS
  Filled 2016-12-03: qty 10

## 2016-12-03 MED ORDER — METRONIDAZOLE 500 MG PO TABS
500.0000 mg | ORAL_TABLET | Freq: Once | ORAL | Status: AC
  Administered 2016-12-03: 500 mg via ORAL
  Filled 2016-12-03: qty 1

## 2016-12-03 MED ORDER — MORPHINE SULFATE (PF) 4 MG/ML IV SOLN
4.0000 mg | Freq: Once | INTRAVENOUS | Status: AC
  Administered 2016-12-03: 4 mg via INTRAVENOUS
  Filled 2016-12-03: qty 1

## 2016-12-03 MED ORDER — ONDANSETRON HCL 4 MG/2ML IJ SOLN
4.0000 mg | Freq: Once | INTRAMUSCULAR | Status: AC
  Administered 2016-12-03: 4 mg via INTRAVENOUS
  Filled 2016-12-03: qty 2

## 2016-12-03 MED ORDER — DOXYCYCLINE HYCLATE 100 MG PO CAPS: 100.0000 mg | ORAL_CAPSULE | Freq: Two times a day (BID) | ORAL | 0 refills | Status: DC

## 2016-12-03 MED FILL — DOXYCYCLINE HYCLATE 100 MG: 100 | 7 days supply | Qty: 14 | Fill #0

## 2016-12-03 MED FILL — HYDROCODON-APAP 5-325: 5-325 | 1 days supply | Qty: 5 | Fill #0

## 2016-12-03 MED FILL — metroNIDAZOLE 500 MG TABS: 500 | 7 days supply | Qty: 14 | Fill #0

## 2016-12-03 NOTE — ED Provider Notes (Signed)
[facility] Provider Note   CSN: [id number] Arrival date & time: 12/03/16  1249     History   Chief Complaint Chief Complaint  Patient presents with  . Abdominal Pain    HPI [name] Whitney is a 31 y.o. female history of bipolar, depression, hypertension, urinary tract infection here presenting with dysuria, vaginal discharge, flank pain. 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 Patient states that for the last several days she's been having intermittent pain as well as flank pain. She has some dysuria and frequency. She also has some vaginal discharge and is sexually active. She states that she had hx of trichomonas in the past but no gonorrhea or chlamydia. She also has subjective chills, nausea but no actual fevers. Denies sick contacts.     The history is provided by the patient.    Past Medical History:  Diagnosis Date  . Bipolar 1 disorder (HCC)   . Depression   . Depression   . Hypertension    due to renal artery stenosis diagnosed by US when young per pt  . Irritable bowel syndrome (IBS)   . Migraine headache     Patient Active Problem List   Diagnosis Date Noted  . Panic disorder without agoraphobia 06/14/2011  . Adjustment disorder with anxiety 06/08/2011    History reviewed. No pertinent surgical history.  OB History    No data available       Home Medications    Prior to Admission medications   Medication Sig Start Date End Date Taking? Authorizing Provider  ALPRAZolam Prudy Feeler(XANAX) 0.5 MG tablet Take 1 tablet (0.5 mg total) by mouth 2 (two) times daily as needed for anxiety. 07/23/16   Sherren MochaShaw, Eva N, MD  azithromycin (ZITHROMAX) 250 MG tablet Take 1 tablet (250 mg total) by mouth daily. 1 every day until finished. Patient not taking: Reported on 07/23/2016 03/11/16   Melene PlanFloyd, Dan, DO  citalopram (CELEXA) 20 MG tablet Use 1/2 tab po qd x 2 wks, then 1 tab po qd 07/23/16   Sherren MochaShaw, Eva N, MD  clonazePAM (KLONOPIN) 1 MG tablet Take 1 mg  by mouth 3 (three) times daily.    [provider]  cloNIDine (CATAPRES) 0.1 MG tablet Take 1 tablet (0.1 mg total) by mouth 2 (two) times daily. 07/23/16   Sherren MochaShaw, Eva N, MD  pantoprazole (PROTONIX) 20 MG tablet Take 20 mg by mouth daily.    [provider]    Family History No family history on file.  Social History Social History  Substance Use Topics  . Smoking status: Current Every Day Smoker    Packs/day: 0.50    Years: 3.00    Types: Cigarettes  . Smokeless tobacco: Never Used  . Alcohol use Yes     Comment: has tasted alcohol age 31 yrs,, no longer uses     Allergies   Cleocin [clindamycin hcl]; Compazine; Promethazine hcl; and Risperidone and related   Review of Systems Review of Systems  Gastrointestinal: Positive for abdominal pain and nausea.  Genitourinary: Positive for dysuria and vaginal discharge.  All other systems reviewed and are negative.    Physical Exam Updated Vital Signs BP 118/82   Pulse 66   Temp 98.3 F (36.8 C) (Oral)   Resp 18   Ht 5\' 8"  (1.727 m)   Wt 76.2 kg (168 lb)   LMP 11/10/2016   SpO2 100%   BMI 25.54 kg/m   Physical Exam  Constitutional: She  is oriented to person, place, and time.  Tearful, uncomfortable   HENT:  Head: Normocephalic.  MM slightly dry   Eyes: Pupils are equal, round, and reactive to light. Conjunctivae and EOM are normal.  Neck: Normal range of motion. Neck supple.  Cardiovascular: Normal rate, regular rhythm and normal heart sounds.   Pulmonary/Chest: Effort normal and breath sounds normal. No respiratory distress. She has no wheezes. She has no rales.  Abdominal: Soft. Bowel sounds are normal.  Mild R CVAT, periumbilical tenderness   Genitourinary:  Genitourinary Comments: Whitish discharge, no obvious CMT or adnexal tenderness.   Musculoskeletal: Normal range of motion.  Neurological: She is alert and oriented to person, place, and time. No cranial nerve deficit. Coordination normal.    Skin: Skin is warm.  Psychiatric: She has a normal mood and affect.  Nursing note and vitals reviewed.    ED Treatments / Results  Labs (all labs ordered are listed, but only abnormal results are displayed) Labs Reviewed  URINALYSIS, ROUTINE W REFLEX MICROSCOPIC - Abnormal; Notable for the following:       Result Value   Leukocytes, UA MODERATE (*)    All other components within normal limits  URINALYSIS, MICROSCOPIC (REFLEX) - Abnormal; Notable for the following:    Bacteria, UA FEW (*)    Squamous Epithelial / LPF 0-5 (*)    All other components within normal limits  URINE CULTURE  WET PREP, GENITAL  PREGNANCY, URINE  CBC WITH DIFFERENTIAL/PLATELET  COMPREHENSIVE METABOLIC PANEL  GC/CHLAMYDIA PROBE AMP ([place]) NOT AT [facility]    EKG  EKG Interpretation None       Radiology No results found.  Procedures Procedures (including critical care time)  Medications Ordered in ED Medications  morphine 4 MG/ML injection 4 mg (not administered)  cefTRIAXone (ROCEPHIN) 1 g in dextrose 5 % 50 mL IVPB (not administered)  ondansetron (ZOFRAN) injection 4 mg (not administered)  sodium chloride 0.9 % bolus 1,000 mL (1,000 mLs Intravenous New Bag/Given 12/03/16 1439)     Initial Impression / Assessment and Plan / ED Course  I have reviewed the triage vital signs and the nursing notes.  Pertinent labs & imaging results that were available during my care of the patient were reviewed by me and considered in my medical decision making (see chart for details).    [name] Whitney is a 31 y.o. female here with dysuria, flank pain, nausea, vaginal discharge. Consider pyelo vs renal colic, less likely PID or appy. Will get labs, UA, CT renal stone study.   3:38 PM CT showed no stone. WBC count normal. Wet prep + trichomonas and clue cells. UA + leuk as well. Given rocephin in the ED to treat for [facility] and UTI. Will dc home with doxycyline which will treat for chlamydia and flagyl for  trichomonas and BV.    Final Clinical Impressions(s) / ED Diagnoses   Final diagnoses:  None    New Prescriptions New Prescriptions   No medications on file     Charlynne Pander, MD 12/03/16 1539

## 2016-12-03 NOTE — ED Triage Notes (Signed)
Abdominal pain. She thinks she has a UTI.

## 2016-12-03 NOTE — Discharge Instructions (Signed)
Take flagyl twice daily for a week for BV and trichomonas. Please use condoms and your partner needs treatment   Take doxycycline twice daily for a week for UTI.   See your doctor  Take motrin, tylenol for pain.   Take vicodin for severe pain. Do NOT drive with it   Return to ER if you have worse abdominal pain, vomiting, fever, vaginal discharge, pelvic pain

## 2016-12-05 LAB — URINE CULTURE: Culture: 60000 — AB

## 2016-12-06 ENCOUNTER — Telehealth: Payer: Self-pay | Admitting: *Deleted

## 2016-12-06 LAB — GC/CHLAMYDIA PROBE AMP (~~LOC~~) NOT AT ARMC
Chlamydia: NEGATIVE
Neisseria Gonorrhea: NEGATIVE

## 2016-12-06 NOTE — Telephone Encounter (Signed)
Post ED Visit - Positive Culture Follow-up  Culture report reviewed by antimicrobial stewardship pharmacist:  []  Enzo BiNathan Batchelder, Pharm.D. []  Celedonio MiyamotoJeremy Frens, Pharm.D., BCPS AQ-ID []  Garvin FilaMike Maccia, Pharm.D., BCPS []  Georgina PillionElizabeth Martin, Pharm.D., BCPS []  Smith ValleyMinh Pham, [address] BoulevardPharm.D., BCPS, AAHIVP []  Estella HuskMichelle Turner, Pharm.D., BCPS, AAHIVP []  Lysle Pearlachel Rumbarger, PharmD, BCPS []  Casilda Carlsaylor Stone, PharmD, BCPS []  Pollyann SamplesAndy Johnston, PharmD, BCPS Graciella FreerLindsey Layden, PA-C  Positive urine culture Treated with Metronidazole and Doxycycline Hyclate, organism sensitive to the same and no further patient follow-up is required at this time.  Virl AxeRobertson, Persephanie Laatsch Talley 12/06/2016, 3:10 PM

## 2017-01-14 IMAGING — CR DG HAND COMPLETE 3+V*R*
3 series · 3 of 3 positions shown · non-contrast
Comparison: None.

CLINICAL DATA: Closed hand in car door 4 days ago, then fell on it.
Hand pain and abrasions. Initial encounter.

EXAM:
RIGHT HAND - COMPLETE 3+ VIEW

[x hand pa right]
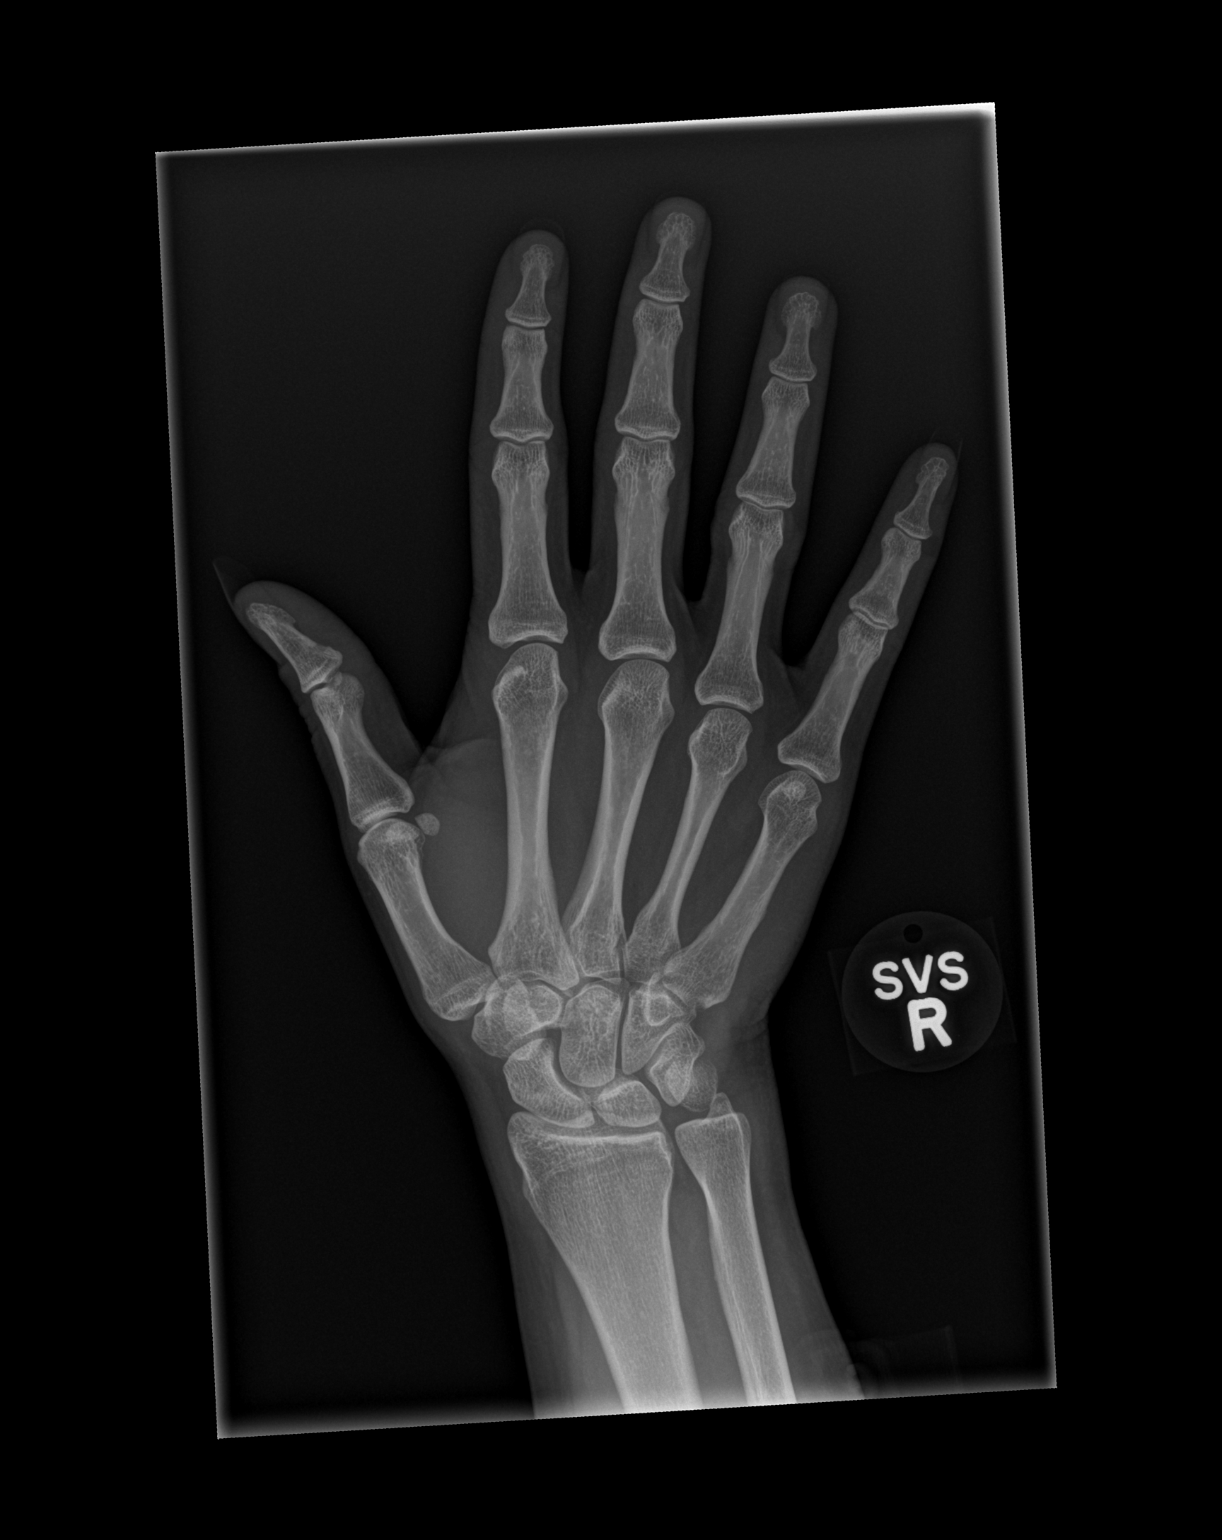

[x hand obl right]
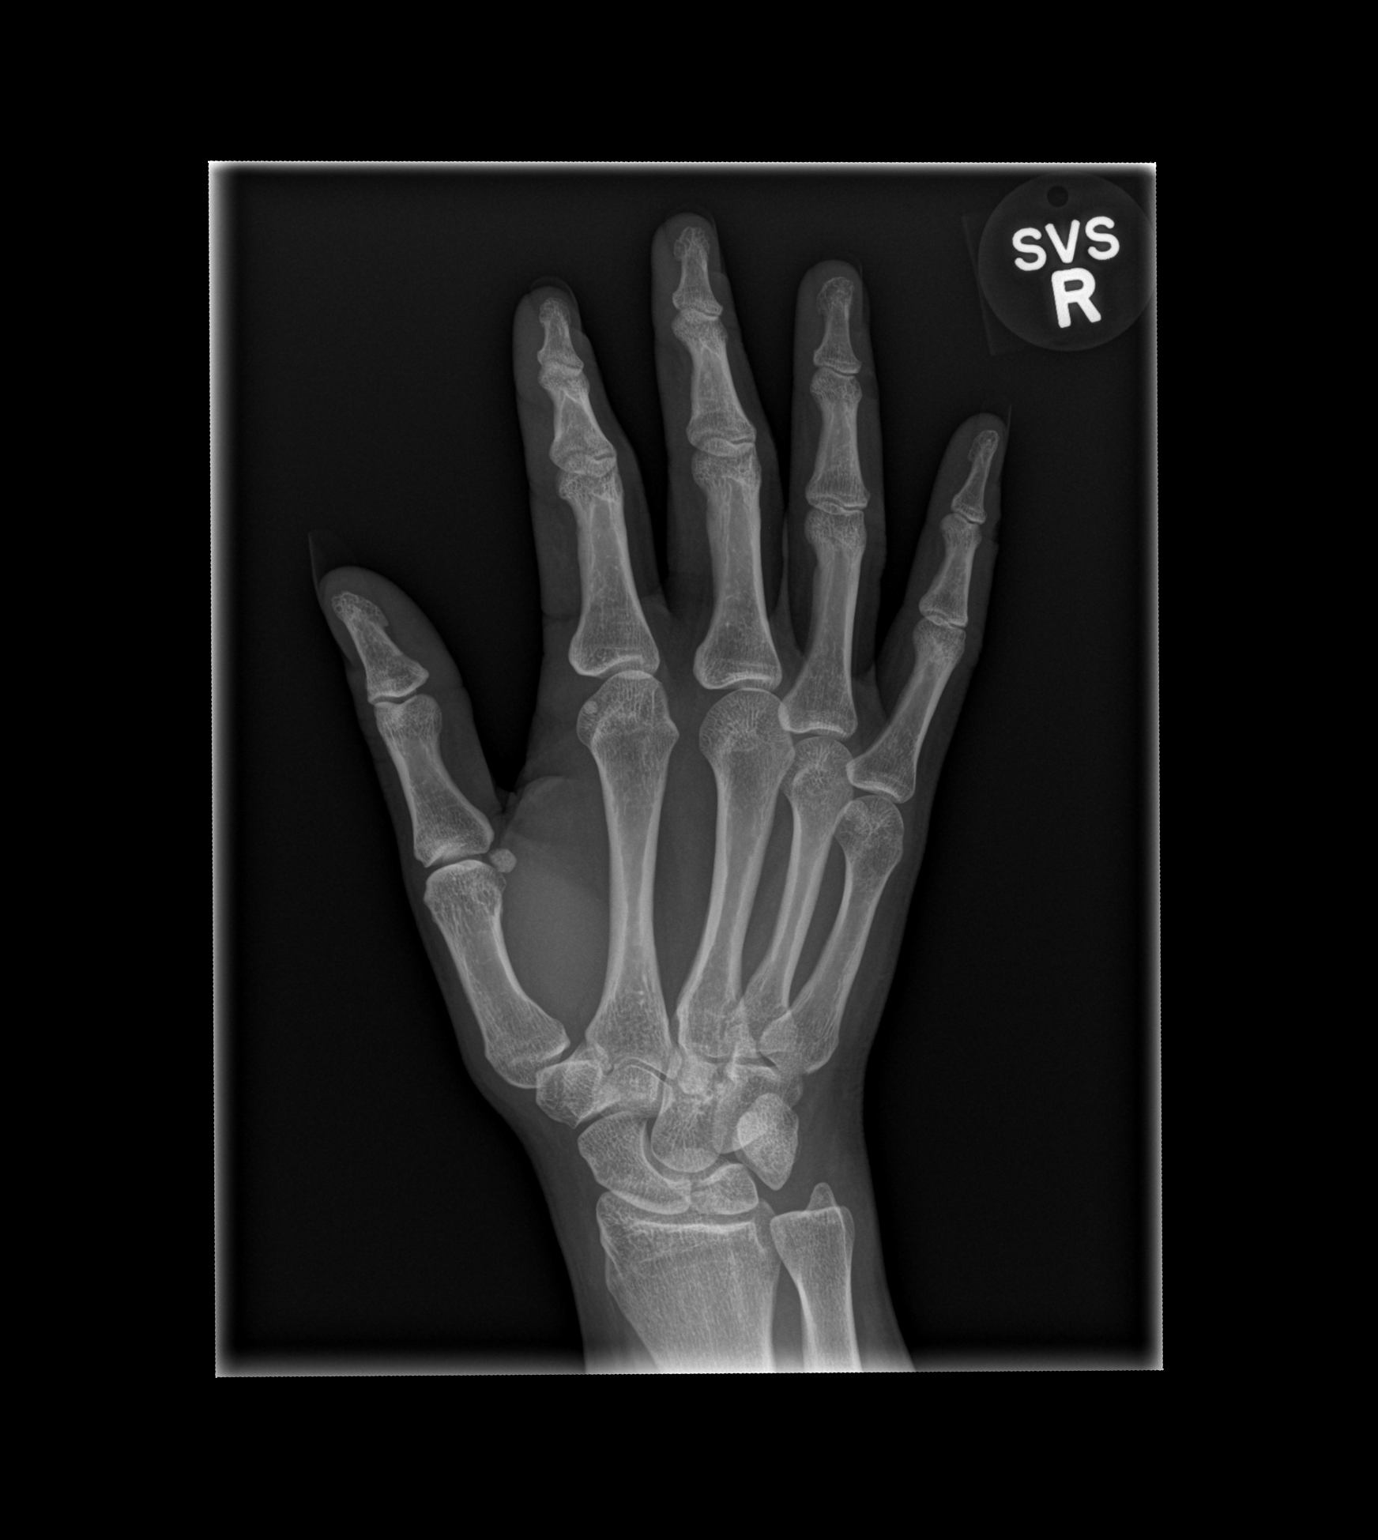

[x hand lat right]
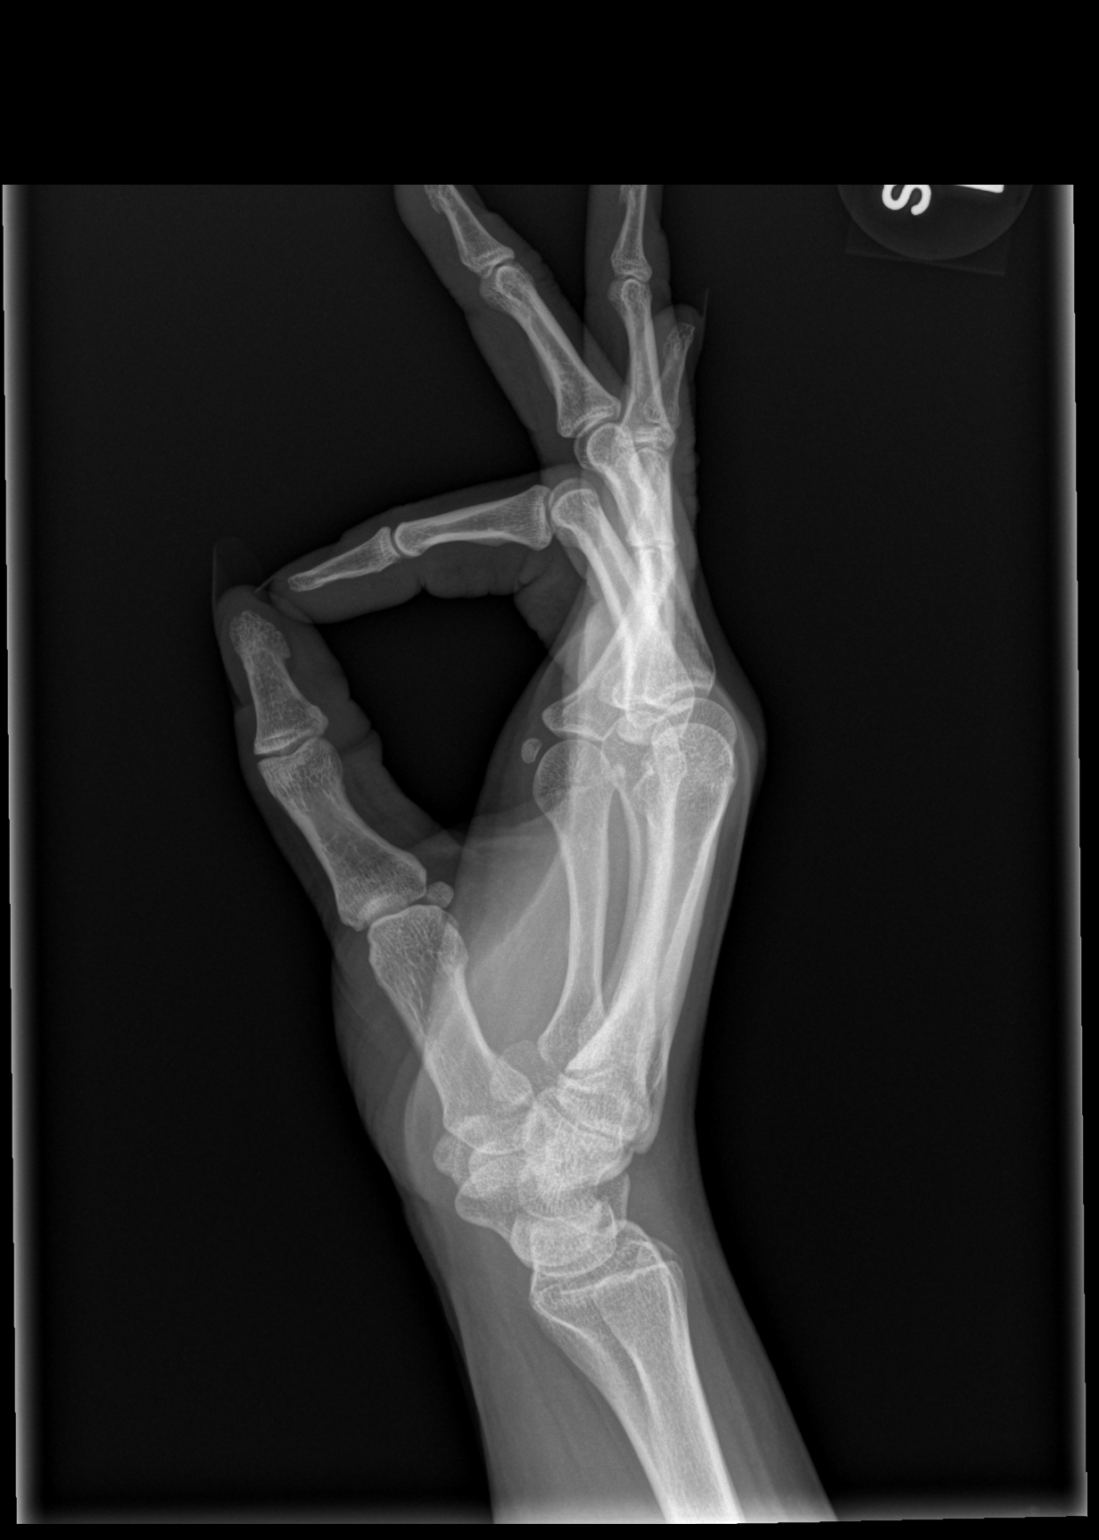

[3 of 3 positions shown; findings below may reference images not displayed]

FINDINGS: There is no evidence of fracture or dislocation. There is no
evidence of arthropathy or other focal bone abnormality. Soft
tissues are unremarkable.
IMPRESSION: Negative.

## 2017-01-24 ENCOUNTER — Telehealth: Payer: Self-pay

## 2017-01-24 NOTE — Telephone Encounter (Signed)
LMVM for patient to CB to review overdue health maintenance.  Left the patient the number of my direct line.

## 2017-08-11 ENCOUNTER — Emergency Department (HOSPITAL_BASED_OUTPATIENT_CLINIC_OR_DEPARTMENT_OTHER)
Admission: EM | Admit: 2017-08-11 | Discharge: 2017-08-11 | Disposition: A | Discharge status: Home / Self Care | Payer: Medicaid Other | Attending: Emergency Medicine | Admitting: Emergency Medicine

## 2017-08-11 ENCOUNTER — Encounter (HOSPITAL_BASED_OUTPATIENT_CLINIC_OR_DEPARTMENT_OTHER): Payer: Self-pay | Admitting: Emergency Medicine

## 2017-08-11 ENCOUNTER — Other Ambulatory Visit: Payer: Self-pay

## 2017-08-11 ENCOUNTER — Emergency Department (HOSPITAL_BASED_OUTPATIENT_CLINIC_OR_DEPARTMENT_OTHER): Payer: Medicaid Other

## 2017-08-11 DIAGNOSIS — N3000 Acute cystitis without hematuria: Secondary | ICD-10-CM | POA: Diagnosis not present

## 2017-08-11 DIAGNOSIS — I10 Essential (primary) hypertension: Secondary | ICD-10-CM | POA: Diagnosis not present

## 2017-08-11 DIAGNOSIS — O26891 Other specified pregnancy related conditions, first trimester: Secondary | ICD-10-CM | POA: Diagnosis not present

## 2017-08-11 DIAGNOSIS — Z87891 Personal history of nicotine dependence: Secondary | ICD-10-CM | POA: Insufficient documentation

## 2017-08-11 DIAGNOSIS — B9689 Other specified bacterial agents as the cause of diseases classified elsewhere: Secondary | ICD-10-CM

## 2017-08-11 DIAGNOSIS — Z3A01 Less than 8 weeks gestation of pregnancy: Secondary | ICD-10-CM | POA: Diagnosis not present

## 2017-08-11 DIAGNOSIS — R102 Pelvic and perineal pain: Secondary | ICD-10-CM | POA: Insufficient documentation

## 2017-08-11 DIAGNOSIS — Z79899 Other long term (current) drug therapy: Secondary | ICD-10-CM | POA: Diagnosis not present

## 2017-08-11 DIAGNOSIS — N760 Acute vaginitis: Secondary | ICD-10-CM | POA: Diagnosis not present

## 2017-08-11 LAB — URINALYSIS, MICROSCOPIC (REFLEX)

## 2017-08-11 LAB — COMPREHENSIVE METABOLIC PANEL
ALT: 17 U/L (ref 14–54)
AST: 21 U/L (ref 15–41)
Albumin: 4 g/dL (ref 3.5–5.0)
Alkaline Phosphatase: 50 U/L (ref 38–126)
Anion gap: 7 (ref 5–15)
BILIRUBIN TOTAL: 0.3 mg/dL (ref 0.3–1.2)
BUN: 11 mg/dL (ref 6–20)
CO2: 22 mmol/L (ref 22–32)
Calcium: 8.6 mg/dL — ABNORMAL LOW (ref 8.9–10.3)
Chloride: 107 mmol/L (ref 101–111)
Creatinine, Ser: 0.68 mg/dL (ref 0.44–1.00)
Glucose, Bld: 101 mg/dL — ABNORMAL HIGH (ref 65–99)
POTASSIUM: 3.8 mmol/L (ref 3.5–5.1)
Sodium: 136 mmol/L (ref 135–145)
TOTAL PROTEIN: 7.3 g/dL (ref 6.5–8.1)

## 2017-08-11 LAB — CBC WITH DIFFERENTIAL/PLATELET
BASOS ABS: 0 10*3/uL (ref 0.0–0.1)
Basophils Relative: 0 %
Eosinophils Absolute: 0.2 10*3/uL (ref 0.0–0.7)
Eosinophils Relative: 3 %
HEMATOCRIT: 38.9 % (ref 36.0–46.0)
Hemoglobin: 13.3 g/dL (ref 12.0–15.0)
LYMPHS ABS: 1.5 10*3/uL (ref 0.7–4.0)
LYMPHS PCT: 17 %
MCH: 31.7 pg (ref 26.0–34.0)
MCHC: 34.2 g/dL (ref 30.0–36.0)
MCV: 92.8 fL (ref 78.0–100.0)
MONO ABS: 0.6 10*3/uL (ref 0.1–1.0)
MONOS PCT: 7 %
NEUTROS ABS: 6.5 10*3/uL (ref 1.7–7.7)
Neutrophils Relative %: 73 %
Platelets: 242 10*3/uL (ref 150–400)
RBC: 4.19 MIL/uL (ref 3.87–5.11)
RDW: 12.5 % (ref 11.5–15.5)
WBC: 8.9 10*3/uL (ref 4.0–10.5)

## 2017-08-11 LAB — LIPASE, BLOOD: LIPASE: 25 U/L (ref 11–51)

## 2017-08-11 LAB — PREGNANCY, URINE: Preg Test, Ur: POSITIVE — AB

## 2017-08-11 LAB — URINALYSIS, ROUTINE W REFLEX MICROSCOPIC
BILIRUBIN URINE: NEGATIVE
GLUCOSE, UA: NEGATIVE mg/dL
HGB URINE DIPSTICK: NEGATIVE
Ketones, ur: NEGATIVE mg/dL
Nitrite: NEGATIVE
PH: 6 (ref 5.0–8.0)
Protein, ur: NEGATIVE mg/dL
SPECIFIC GRAVITY, URINE: 1.01 (ref 1.005–1.030)

## 2017-08-11 LAB — WET PREP, GENITAL
Sperm: NONE SEEN
Trich, Wet Prep: NONE SEEN
Yeast Wet Prep HPF POC: NONE SEEN

## 2017-08-11 LAB — HCG, QUANTITATIVE, PREGNANCY: HCG, BETA CHAIN, QUANT, S: 69816 m[IU]/mL — AB (ref <5)

## 2017-08-11 MED ORDER — CEPHALEXIN 500 MG PO CAPS: 500.0000 mg | ORAL_CAPSULE | Freq: Two times a day (BID) | ORAL | 0 refills | Status: AC

## 2017-08-11 MED ORDER — METRONIDAZOLE 500 MG PO TABS: 500.0000 mg | ORAL_TABLET | Freq: Two times a day (BID) | ORAL | 0 refills | Status: AC

## 2017-08-11 MED FILL — metroNIDAZOLE 500 MG TABS: 500 | 7 days supply | Qty: 14 | Fill #0

## 2017-08-11 MED FILL — CEPHALEXIN 500 MG CAPSULE: 500 | 7 days supply | Qty: 14 | Fill #0

## 2017-08-11 NOTE — Discharge Instructions (Signed)
We found that you are approximately 7 weeks and 6 days pregnant today on ultrasound.  We also found evidence of bacterial vaginosis and urinary tract infection.  Please take the antibiotics to treat this.  Please start taking prenatal vitamins and stay hydrated.  Please follow-up with your OB/GYN team for further management.  If any symptoms change or worsen, please return to the nearest emergency department.

## 2017-08-11 NOTE — ED Provider Notes (Signed)
[facility] EMERGENCY DEPARTMENT Provider Note   CSN: [id number] Arrival date & time: 08/11/17  0801     History   Chief Complaint Chief Complaint  Patient presents with  . Dysuria  . Back Pain    HPI [name] Whitney is a 32 y.o. female.  The history is provided by the patient and medical records.  Abdominal Pain   This is a new problem. The current episode started more than 2 days ago. The problem occurs daily. The problem has not changed since onset.The pain is associated with an unknown factor. The pain is located in the suprapubic region. The quality of the pain is aching. The pain is mild. Associated symptoms include diarrhea, nausea, vomiting, constipation and dysuria. Pertinent negatives include fever, frequency, hematuria and headaches. The symptoms are aggravated by palpation. Nothing relieves the symptoms.  Vaginal Discharge   This is a new problem. The current episode started more than 2 days ago. The problem occurs constantly. The problem has not changed since onset.The discharge was normal. Pregnant now: ubknown. She has missed her period. Associated symptoms include abdominal pain, constipation, diarrhea, nausea, vomiting and dysuria. Pertinent negatives include no diaphoresis, no fever and no frequency.    Past Medical History:  Diagnosis Date  . Bipolar 1 disorder (HCC)   . Depression   . Depression   . Hypertension    due to renal artery stenosis diagnosed by [place] when young per pt  . Irritable bowel syndrome (IBS)   . Migraine headache     Patient Active Problem List   Diagnosis Date Noted  . Panic disorder without agoraphobia 06/14/2011  . Adjustment disorder with anxiety 06/08/2011    History reviewed. No pertinent surgical history.   OB History   None      Home Medications    Prior to Admission medications   Medication Sig Start Date End Date Taking? Authorizing Provider  ALPRAZolam Prudy Feeler) 0.5 MG tablet Take 1 tablet (0.5 mg total)  by mouth 2 (two) times daily as needed for anxiety. 07/23/16   Sherren Mocha, MD  azithromycin (ZITHROMAX) 250 MG tablet Take 1 tablet (250 mg total) by mouth daily. 1 every day until finished. Patient not taking: Reported on 07/23/2016 03/11/16   Melene Plan, DO  citalopram (CELEXA) 20 MG tablet Use 1/2 tab po qd x 2 wks, then 1 tab po qd 07/23/16   Sherren Mocha, MD  clonazePAM (KLONOPIN) 1 MG tablet Take 1 mg by mouth 3 (three) times daily.    [provider]  cloNIDine (CATAPRES) 0.1 MG tablet Take 1 tablet (0.1 mg total) by mouth 2 (two) times daily. 07/23/16   Sherren Mocha, MD  doxycycline (VIBRAMYCIN) 100 MG capsule Take 1 capsule (100 mg total) by mouth 2 (two) times daily. One po bid x 7 days 12/03/16   Charlynne Pander, MD  HYDROcodone-acetaminophen (NORCO/VICODIN) 5-325 MG tablet Take 1 tablet by mouth every 6 (six) hours as needed. 12/03/16   Charlynne Pander, MD  metroNIDAZOLE (FLAGYL) 500 MG tablet Take 1 tablet (500 mg total) by mouth 2 (two) times daily. One po bid x 7 days 12/03/16   Charlynne Pander, MD  pantoprazole (PROTONIX) 20 MG tablet Take 20 mg by mouth daily.    [provider]  FLUoxetine (PROZAC) 40 MG capsule Take 80 mg by mouth daily.   06/07/11  [provider]  lithium carbonate (LITHOBID) 300 MG CR tablet Take 300 mg by mouth daily.  06/07/11  [provider]    Family History No family history on file.  Social History Social History   Tobacco Use  . Smoking status: Former Smoker    Packs/day: 0.50    Years: 3.00    Pack years: 1.50    Types: Cigarettes  . Smokeless tobacco: Never Used  Substance Use Topics  . Alcohol use: Yes    Comment: has tasted alcohol age 16 yrs,, no longer uses  . Drug use: Yes    Types: Marijuana    Comment: THC in past during High School & pregnancy     Allergies   Cleocin [clindamycin hcl]; Compazine; Promethazine hcl; and Risperidone and related   Review of Systems Review of Systems    Constitutional: Negative for chills, diaphoresis, fatigue and fever.  HENT: Negative for congestion.   Eyes: Negative for visual disturbance.  Respiratory: Negative for cough, chest tightness, shortness of breath and wheezing.   Gastrointestinal: Positive for abdominal pain, constipation, diarrhea, nausea and vomiting.  Genitourinary: Positive for dysuria and vaginal discharge. Negative for decreased urine volume, flank pain, frequency, hematuria, pelvic pain, urgency, vaginal bleeding and vaginal pain.  Musculoskeletal: Positive for back pain. Negative for neck pain and neck stiffness.  Skin: Negative for rash and wound.  Neurological: Negative for weakness, light-headedness and headaches.  Psychiatric/Behavioral: Negative for agitation.  All other systems reviewed and are negative.    Physical Exam Updated Vital Signs BP 119/83 (BP Location: Right Arm)   Pulse 78   Temp 97.7 F (36.5 C) (Oral)   Resp 16   Ht  (1.727 m)   Wt 79.8 kg (176 lb)   LMP 06/11/2017   SpO2 100%   BMI 26.76 kg/m   Physical Exam  Constitutional: She is oriented to person, place, and time. She appears well-developed and well-nourished. No distress.  HENT:  Head: Normocephalic.  Nose: Nose normal.  Mouth/Throat: Oropharynx is clear and moist. No oropharyngeal exudate.  Eyes: Pupils are equal, round, and reactive to light. Conjunctivae and EOM are normal.  Neck: Normal range of motion. Neck supple.  Cardiovascular: Normal rate and intact distal pulses.  No murmur heard. Pulmonary/Chest: Effort normal. No stridor. No respiratory distress. She has no wheezes. She exhibits no tenderness.  Abdominal: Soft. She exhibits no distension. There is no tenderness.  Genitourinary: Pelvic exam was performed with patient supine. Uterus is not tender. Cervix exhibits discharge. Cervix exhibits no motion tenderness and no friability. Right adnexum displays no tenderness. Left adnexum displays no tenderness. No  erythema or bleeding in the vagina. Vaginal discharge found.  Musculoskeletal: She exhibits no edema or tenderness.  Neurological: She is alert and oriented to person, place, and time. No sensory deficit. She exhibits normal muscle tone.  Skin: Capillary refill takes less than 2 seconds. No rash noted. She is not diaphoretic. No erythema.  Psychiatric: She has a normal mood and affect.  Nursing note and vitals reviewed.    ED Treatments / Results  Labs (all labs ordered are listed, but only abnormal results are displayed) Labs Reviewed  WET PREP, GENITAL - Abnormal; Notable for the following components:      Result Value   Clue Cells Wet Prep HPF POC PRESENT (*)    WBC, Wet Prep HPF POC MANY (*)    All other components within normal limits  URINALYSIS, ROUTINE W REFLEX MICROSCOPIC - Abnormal; Notable for the following components:   APPearance CLOUDY (*)    Leukocytes, UA LARGE (*)  All other components within normal limits  PREGNANCY, URINE - Abnormal; Notable for the following components:   Preg Test, Ur POSITIVE (*)    All other components within normal limits  COMPREHENSIVE METABOLIC PANEL - Abnormal; Notable for the following components:   Glucose, Bld 101 (*)    Calcium 8.6 (*)    All other components within normal limits  URINALYSIS, MICROSCOPIC (REFLEX) - Abnormal; Notable for the following components:   Bacteria, UA MANY (*)    All other components within normal limits  HCG, QUANTITATIVE, PREGNANCY - Abnormal; Notable for the following components:   hCG, Beta Chain, Quant, S 16,109 (*)    All other components within normal limits  URINE CULTURE  CBC WITH DIFFERENTIAL/PLATELET  LIPASE, BLOOD  GC/CHLAMYDIA PROBE AMP ([place]) NOT AT [facility]    EKG None  Radiology US Ob Comp < 14 Wks  Result Date: 08/11/2017 CLINICAL DATA:  Abdominal pain, flank pain EXAM: OBSTETRIC <14 WK [place] AND TRANSVAGINAL OB US TECHNIQUE: Both transabdominal and transvaginal ultrasound  examinations were performed for complete evaluation of the gestation as well as the maternal uterus, adnexal regions, and pelvic cul-de-sac. Transvaginal technique was performed to assess early pregnancy. COMPARISON:  CT 12/03/2016 FINDINGS: Intrauterine gestational sac: Single Yolk sac:  Visualized Embryo:  Visualized Cardiac Activity: Visualized Heart Rate: 150 bpm MSD:   mm    w     d CRL:  14.8 mm   7 w   6 d                  [place] EDC: 03/24/2018 Subchorionic hemorrhage:  None visualized. Maternal uterus/adnexae: No adnexal mass. Small amount of free fluid in the pelvis. IMPRESSION: Seven week 6 day intrauterine pregnancy. Fetal heart rate 150 beats per minute. No acute maternal findings. Electronically Signed   By: Charlett Nose M.D.   On: 08/11/2017 11:22   US Ob Transvaginal  Result Date: 08/11/2017 CLINICAL DATA:  Abdominal pain, flank pain EXAM: OBSTETRIC <14 WK [place] AND TRANSVAGINAL OB US TECHNIQUE: Both transabdominal and transvaginal ultrasound examinations were performed for complete evaluation of the gestation as well as the maternal uterus, adnexal regions, and pelvic cul-de-sac. Transvaginal technique was performed to assess early pregnancy. COMPARISON:  CT 12/03/2016 FINDINGS: Intrauterine gestational sac: Single Yolk sac:  Visualized Embryo:  Visualized Cardiac Activity: Visualized Heart Rate: 150 bpm MSD:   mm    w     d CRL:  14.8 mm   7 w   6 d                  [place] EDC: 03/24/2018 Subchorionic hemorrhage:  None visualized. Maternal uterus/adnexae: No adnexal mass. Small amount of free fluid in the pelvis. IMPRESSION: Seven week 6 day intrauterine pregnancy. Fetal heart rate 150 beats per minute. No acute maternal findings. Electronically Signed   By: Charlett Nose M.D.   On: 08/11/2017 11:22    Procedures Procedures (including critical care time)  Medications Ordered in ED Medications - No data to display   Initial Impression / Assessment and Plan / ED Course  I have reviewed the triage  vital signs and the nursing notes.  Pertinent labs & imaging results that were available during my care of the patient were reviewed by me and considered in my medical decision making (see chart for details).     [name] Whitney is a 32 y.o. female with a past medical history significant for bipolar disorder, hypertension, depression, migraines, and  irritable bowel syndrome who presents with missed menstrual cycles, abdominal cramping, vaginal discharge, dysuria, nausea and vomiting.  Patient reports that she thinks he may be pregnant as she has missed 2 menstrual cycles.  She says her last period was March 11 which would put her at 9 weeks and 3 days.  She says that she has had no vaginal bleeding but has had vaginal discharge.  She denies any recent trauma.  She reports mild abdominal cramping in her suprapubic area.  It does not radiate to the rest of the abdomen but is very mild in her low back.  She denies history of kidney stones.  She denies hematuria but does report some dysuria.  She reports some clear vaginal discharge.  She denies any chest pain, shortness breath, or cough but does report some intermittent constipation and diarrhea.  She reports some nausea and vomiting.  On exam, abdomen is nontender.  Pelvic exam was performed with discharge.  No cervical motion tenderness or adnexal tenderness.  No CVA tenderness.  No vaginal bleeding seen.  Lungs clear.  Patient otherwise appears well  Work-up shows the patient is pregnant.  hCG was performed.  Patient denies vaginal bleeding, do not feel she needs ABO/Rh or RhoGam at this time.  Urinalysis shows evidence of UTI and wet prep showed BV.  Patient will be treated for UTI and BV in the setting of pregnancy.  Ultrasound was performed showing no evidence of ectopic and a viable pregnancy at 7 weeks and 6 days.  Patient felt reassured after work-up.  Patient will start taking prenatal vitamins and follow-up with an OB/GYN.  Patient will take  antibiotics for her infections.  Patient was understanding of the plan of care and had no other questions or concerns.  Patient discharged in good condition.        Final Clinical Impressions(s) / ED Diagnoses   Final diagnoses:  Less than [redacted] weeks gestation of pregnancy  Acute cystitis without hematuria  Bacterial vaginosis    ED Discharge Orders        Ordered    cephALEXin (KEFLEX) 500 MG capsule  2 times daily     08/11/17 1339    metroNIDAZOLE (FLAGYL) 500 MG tablet  2 times daily     08/11/17 1339      Clinical Impression: 1. Less than [redacted] weeks gestation of pregnancy   2. Acute cystitis without hematuria   3. Bacterial vaginosis     Disposition: Discharge  Condition: Good  I have discussed the results, Dx and Tx plan with the pt(& family if present). He/she/they expressed understanding and agree(s) with the plan. Discharge instructions discussed at great length. Strict return precautions discussed and pt &/or family have verbalized understanding of the instructions. No further questions at time of discharge.    Discharge Medication List as of 08/11/2017  1:39 PM    START taking these medications   Details  cephALEXin (KEFLEX) 500 MG capsule Take 1 capsule (500 mg total) by mouth 2 (two) times daily for 7 days., Starting Thu 08/11/2017, Until Thu 08/18/2017, Print    !! metroNIDAZOLE (FLAGYL) 500 MG tablet Take 1 tablet (500 mg total) by mouth 2 (two) times daily for 7 days., Starting Thu 08/11/2017, Until Thu 08/18/2017, Print     !! - Potential duplicate medications found. Please discuss with provider.      Follow Up: Sherren Mocha, MD [address] [place] [address] [phone]     [facility] EMERGENCY  DEPARTMENT [address] [id number] [id number] [place] [address] [phone]       Tegeler, Canary Brim, MD 08/11/17 1816

## 2017-08-11 NOTE — ED Triage Notes (Signed)
Dysuria x 1 week. Bilateral lower back pain x 1 month. States she could be pregnant.

## 2017-08-12 LAB — GC/CHLAMYDIA PROBE AMP (~~LOC~~) NOT AT ARMC
Chlamydia: NEGATIVE
Neisseria Gonorrhea: NEGATIVE

## 2017-08-13 LAB — URINE CULTURE: Culture: 80000 — AB

## 2017-08-14 ENCOUNTER — Telehealth: Payer: Self-pay

## 2017-08-14 NOTE — Telephone Encounter (Incomplete)
Post ED Visit - Positive Culture Follow-up  Culture report reviewed by antimicrobial stewardship pharmacist:   Enzo Bi, Pharm.D.  Celedonio Miyamoto, Pharm.D., BCPS AQ-ID  Garvin Fila, Pharm.D., BCPS  Georgina Pillion, [address].D., BCPS  [place], [address]D., BCPS, AAHIVP  Estella Husk, Pharm.D., BCPS, AAHIVP  Lysle Pearl, PharmD, BCPS  Sherlynn Carbon, PharmD  Pollyann Samples, PharmD, BCPS Sharin Mons Pharm D Positive urine culture Treated with Cephalexin, organism sensitive to the same and no further patient follow-up is required at this time.  Jerry Caras 08/14/2017, 10:27 AM

## 2018-09-21 ENCOUNTER — Other Ambulatory Visit: Payer: Self-pay

## 2018-09-21 ENCOUNTER — Encounter (HOSPITAL_BASED_OUTPATIENT_CLINIC_OR_DEPARTMENT_OTHER): Payer: Self-pay | Admitting: *Deleted

## 2018-09-21 ENCOUNTER — Emergency Department (HOSPITAL_BASED_OUTPATIENT_CLINIC_OR_DEPARTMENT_OTHER): Payer: Medicaid Other

## 2018-09-21 ENCOUNTER — Emergency Department (HOSPITAL_BASED_OUTPATIENT_CLINIC_OR_DEPARTMENT_OTHER)
Admission: EM | Admit: 2018-09-21 | Discharge: 2018-09-21 | Disposition: A | Discharge status: Home / Self Care | Payer: Medicaid Other | Attending: Emergency Medicine | Admitting: Emergency Medicine

## 2018-09-21 DIAGNOSIS — R1011 Right upper quadrant pain: Secondary | ICD-10-CM

## 2018-09-21 DIAGNOSIS — Z79899 Other long term (current) drug therapy: Secondary | ICD-10-CM | POA: Diagnosis not present

## 2018-09-21 DIAGNOSIS — I10 Essential (primary) hypertension: Secondary | ICD-10-CM | POA: Insufficient documentation

## 2018-09-21 DIAGNOSIS — Z87891 Personal history of nicotine dependence: Secondary | ICD-10-CM | POA: Diagnosis not present

## 2018-09-21 DIAGNOSIS — K8050 Calculus of bile duct without cholangitis or cholecystitis without obstruction: Secondary | ICD-10-CM | POA: Diagnosis not present

## 2018-09-21 LAB — CBC WITH DIFFERENTIAL/PLATELET
Abs Immature Granulocytes: 0.05 10*3/uL (ref 0.00–0.07)
Basophils Absolute: 0.1 10*3/uL (ref 0.0–0.1)
Basophils Relative: 0 %
Eosinophils Absolute: 0.4 10*3/uL (ref 0.0–0.5)
Eosinophils Relative: 3 %
HCT: 39.2 % (ref 36.0–46.0)
Hemoglobin: 12.2 g/dL (ref 12.0–15.0)
Immature Granulocytes: 0 %
Lymphocytes Relative: 17 %
Lymphs Abs: 2.1 10*3/uL (ref 0.7–4.0)
MCH: 28.4 pg (ref 26.0–34.0)
MCHC: 31.1 g/dL (ref 30.0–36.0)
MCV: 91.2 fL (ref 80.0–100.0)
Monocytes Absolute: 0.8 10*3/uL (ref 0.1–1.0)
Monocytes Relative: 6 %
Neutro Abs: 8.9 10*3/uL — ABNORMAL HIGH (ref 1.7–7.7)
Neutrophils Relative %: 74 %
Platelets: 305 10*3/uL (ref 150–400)
RBC: 4.3 MIL/uL (ref 3.87–5.11)
RDW: 14.5 % (ref 11.5–15.5)
WBC: 12.3 10*3/uL — ABNORMAL HIGH (ref 4.0–10.5)
nRBC: 0 % (ref 0.0–0.2)

## 2018-09-21 LAB — URINALYSIS, MICROSCOPIC (REFLEX)

## 2018-09-21 LAB — URINALYSIS, ROUTINE W REFLEX MICROSCOPIC
Bilirubin Urine: NEGATIVE
Glucose, UA: NEGATIVE mg/dL
Hgb urine dipstick: NEGATIVE
Ketones, ur: NEGATIVE mg/dL
Nitrite: NEGATIVE
Protein, ur: NEGATIVE mg/dL
Specific Gravity, Urine: <1.005 — ABNORMAL LOW (ref 1.005–1.030)
pH: 6 (ref 5.0–8.0)

## 2018-09-21 LAB — COMPREHENSIVE METABOLIC PANEL
ALT: 41 U/L (ref 0–44)
AST: 39 U/L (ref 15–41)
Albumin: 3.4 g/dL — ABNORMAL LOW (ref 3.5–5.0)
Alkaline Phosphatase: 92 U/L (ref 38–126)
Anion gap: 10 (ref 5–15)
BUN: 10 mg/dL (ref 6–20)
CO2: 22 mmol/L (ref 22–32)
Calcium: 8.8 mg/dL — ABNORMAL LOW (ref 8.9–10.3)
Chloride: 105 mmol/L (ref 98–111)
Creatinine, Ser: 0.91 mg/dL (ref 0.44–1.00)
GFR calc Af Amer: >60 mL/min (ref >60)
GFR calc non Af Amer: >60 mL/min (ref >60)
Glucose, Bld: 97 mg/dL (ref 70–99)
Potassium: 4 mmol/L (ref 3.5–5.1)
Sodium: 137 mmol/L (ref 135–145)
Total Bilirubin: 0.5 mg/dL (ref 0.3–1.2)
Total Protein: 7.3 g/dL (ref 6.5–8.1)

## 2018-09-21 LAB — PREGNANCY, URINE: Preg Test, Ur: NEGATIVE

## 2018-09-21 LAB — LIPASE, BLOOD: Lipase: 21 U/L (ref 11–51)

## 2018-09-21 MED ORDER — MORPHINE SULFATE (PF) 4 MG/ML IV SOLN
4.0000 mg | Freq: Once | INTRAVENOUS | Status: AC
  Administered 2018-09-21: 4 mg via INTRAVENOUS
  Filled 2018-09-21: qty 1

## 2018-09-21 MED ORDER — DICYCLOMINE HCL 20 MG PO TABS
20.0000 mg | ORAL_TABLET | Freq: Every day | ORAL | 0 refills | Status: AC
Start: 2018-09-21 — End: 2018-10-05

## 2018-09-21 NOTE — ED Provider Notes (Signed)
[facility] EMERGENCY DEPARTMENT Provider Note   CSN: [id number] Arrival date & time: 09/21/18  1714    History   Chief Complaint Chief Complaint  Patient presents with  . Abdominal Pain    HPI [name] Whitney is a 33 y.o. female.     33 y.o female with a PMH of HTN, Anxiety, depression, bipolar presents to the ED with a chief complaint of abdominal pain x 6 months.  Patient reports she had an ultrasound of her gallbladder done about 6 months ago, was told he had some gallbladder sludge, this will likely lead to problems in the future today she reports right upper quadrant tenderness x1 week, states feeling like she was punched on her right flank.  This pain is worse after eating.  No alleviating factors.  She has tried ibuprofen, Tylenol without improvement in symptoms.  Also endorses anorexia, states she has had a decrease in her appetite.  Denies any fever, nausea, vomiting, urinary symptoms, chest pain or shortness of breath.No gynecological symptoms.  Denies any surgery to her abdomen.  The history is provided by the patient and medical records.  Abdominal Pain Associated symptoms: diarrhea   Associated symptoms: no chest pain, no chills, no cough, no dysuria, no fever, no hematuria, no nausea, no shortness of breath, no sore throat and no vomiting     Past Medical History:  Diagnosis Date  . Bipolar 1 disorder ([place])   . Depression   . Depression   . Hypertension    due to renal artery stenosis diagnosed by [place] when young per pt  . Irritable bowel syndrome (IBS)   . Migraine headache     Patient Active Problem List   Diagnosis Date Noted  . Panic disorder without agoraphobia 06/14/2011  . Adjustment disorder with anxiety 06/08/2011    History reviewed. No pertinent surgical history.   OB History    Gravida  2   Para  1   Term  1   Preterm  0   AB  0   Living  1     SAB  0   TAB  0   Ectopic  0   Multiple  0   Live Births  1           Home Medications    Prior to Admission medications   Medication Sig Start Date End Date Taking? Authorizing Provider  ALPRAZolam Duanne Moron) 0.5 MG tablet Take 1 tablet (0.5 mg total) by mouth 2 (two) times daily as needed for anxiety. 07/23/16  Yes Shawnee Knapp, MD  cloNIDine (CATAPRES) 0.1 MG tablet Take 1 tablet (0.1 mg total) by mouth 2 (two) times daily. 07/23/16  Yes Shawnee Knapp, MD  pantoprazole (PROTONIX) 20 MG tablet Take 20 mg by mouth daily.   Yes [provider]  azithromycin (ZITHROMAX) 250 MG tablet Take 1 tablet (250 mg total) by mouth daily. 1 every day until finished. Patient not taking: Reported on 07/23/2016 03/11/16   Deno Etienne, DO  citalopram (CELEXA) 20 MG tablet Use 1/2 tab po qd x 2 wks, then 1 tab po qd 07/23/16   Shawnee Knapp, MD  clonazePAM (KLONOPIN) 1 MG tablet Take 1 mg by mouth 3 (three) times daily.    [provider]  dicyclomine (BENTYL) 20 MG tablet Take 1 tablet (20 mg total) by mouth daily for 14 days. 09/21/18 10/05/18  Janeece Fitting, PA-C  doxycycline (VIBRAMYCIN) 100 MG capsule Take 1 capsule (100 mg total)  by mouth 2 (two) times daily. One po bid x 7 days 12/03/16   Charlynne PanderYao, David Hsienta, MD  HYDROcodone-acetaminophen (NORCO/VICODIN) 5-325 MG tablet Take 1 tablet by mouth every 6 (six) hours as needed. 12/03/16   Charlynne PanderYao, David Hsienta, MD  metroNIDAZOLE (FLAGYL) 500 MG tablet Take 1 tablet (500 mg total) by mouth 2 (two) times daily. One po bid x 7 days 12/03/16   Charlynne PanderYao, David Hsienta, MD  FLUoxetine (PROZAC) 40 MG capsule Take 80 mg by mouth daily.   06/07/11  [provider]  lithium carbonate (LITHOBID) 300 MG CR tablet Take 300 mg by mouth daily.   06/07/11  [provider]    Family History No family history on file.  Social History Social History   Tobacco Use  . Smoking status: Former Smoker    Packs/day: 0.50    Years: 3.00    Pack years: 1.50    Types: Cigarettes  . Smokeless tobacco: Never Used  Substance Use Topics  .  Alcohol use: Not Currently    Comment: has tasted alcohol age 33 yrs,, no longer uses  . Drug use: Not Currently    Types: Marijuana    Comment: THC in past during High School & pregnancy     Allergies   Cleocin [clindamycin hcl], Compazine, Promethazine hcl, and Risperidone and related   Review of Systems Review of Systems  Constitutional: Positive for appetite change. Negative for chills and fever.  HENT: Negative for ear pain and sore throat.   Eyes: Negative for pain and visual disturbance.  Respiratory: Negative for cough and shortness of breath.   Cardiovascular: Negative for chest pain and palpitations.  Gastrointestinal: Positive for abdominal pain and diarrhea. Negative for nausea and vomiting.  Genitourinary: Negative for dysuria and hematuria.  Musculoskeletal: Negative for arthralgias and back pain.  Skin: Negative for color change and rash.  Neurological: Negative for seizures and syncope.  All other systems reviewed and are negative.    Physical Exam Updated Vital Signs BP (!) 126/104   Pulse 91   Temp 98.4 F (36.9 C) (Oral)   Resp 20   Ht 5\' 8"  (1.727 m)   Wt 117.5 kg   LMP 09/14/2018   SpO2 99%   BMI 39.38 kg/m   Physical Exam Vitals signs and nursing note reviewed.  Constitutional:      General: She is not in acute distress.    Appearance: She is well-developed.     Comments: Non-ill-appearing.  HENT:     Head: Normocephalic and atraumatic.     Mouth/Throat:     Pharynx: No oropharyngeal exudate.  Eyes:     Pupils: Pupils are equal, round, and reactive to light.  Neck:     Musculoskeletal: Normal range of motion.  Cardiovascular:     Rate and Rhythm: Regular rhythm.     Heart sounds: Normal heart sounds.  Pulmonary:     Effort: Pulmonary effort is normal. No respiratory distress.     Breath sounds: Normal breath sounds.  Abdominal:     General: Bowel sounds are normal. There is no distension.     Palpations: Abdomen is soft.      Tenderness: There is abdominal tenderness in the right upper quadrant. There is right CVA tenderness. There is no guarding. Negative signs include Murphy's sign and McBurney's sign.       Comments: Alden Serverrnest to palpation along the right flank, some discomfort with palpation of the right upper quadrant.  Musculoskeletal:  General: No tenderness or deformity.     Right lower leg: No edema.     Left lower leg: No edema.  Skin:    General: Skin is warm and dry.  Neurological:     Mental Status: She is alert and oriented to person, place, and time.      ED Treatments / Results  Labs (all labs ordered are listed, but only abnormal results are displayed) Labs Reviewed  CBC WITH DIFFERENTIAL/PLATELET - Abnormal; Notable for the following components:      Result Value   WBC 12.3 (*)    Neutro Abs 8.9 (*)    All other components within normal limits  COMPREHENSIVE METABOLIC PANEL - Abnormal; Notable for the following components:   Calcium 8.8 (*)    Albumin 3.4 (*)    All other components within normal limits  URINALYSIS, ROUTINE W REFLEX MICROSCOPIC - Abnormal; Notable for the following components:   APPearance HAZY (*)    Specific Gravity, Urine <1.005 (*)    Leukocytes,Ua LARGE (*)    All other components within normal limits  URINALYSIS, MICROSCOPIC (REFLEX) - Abnormal; Notable for the following components:   Bacteria, UA FEW (*)    All other components within normal limits  LIPASE, BLOOD  PREGNANCY, URINE    EKG None  Radiology [place] Abdomen Limited Ruq  Result Date: 09/21/2018 CLINICAL DATA:  Right upper quadrant pain EXAM: ULTRASOUND ABDOMEN LIMITED RIGHT UPPER QUADRANT COMPARISON:  Ultrasound 12/24/2017 FINDINGS: Gallbladder: Small stones measuring up to 5 mm. Normal wall thickness. Negative sonographic Murphy Common bile duct: Diameter: 3 mm Liver: Increased hepatic echogenicity. This limits evaluation of the parenchyma. No obvious focal abnormality is seen. Portal vein  is patent on color Doppler imaging with normal direction of blood flow towards the liver. IMPRESSION: 1. Cholelithiasis without sonographic evidence for acute cholecystitis 2. Echogenic liver consistent with steatosis Electronically Signed   By: Jasmine PangKim  Fujinaga M.D.   On: 09/21/2018 19:30    Procedures Procedures (including critical care time)  Medications Ordered in ED Medications  morphine 4 MG/ML injection 4 mg (4 mg Intravenous Given 09/21/18 1805)     Initial Impression / Assessment and Plan / ED Course  I have reviewed the triage vital signs and the nursing notes.  Pertinent labs & imaging results that were available during my care of the patient were reviewed by me and considered in my medical decision making (see chart for details).      Patient with a past medical history of GERD presents to the ER complaining of right upper quadrant pain x6 months.  Reports his pain is localized around the right upper quadrant worse after eating.  CBC shows slight leukocytosis 12.3, hemoglobin is within normal limits.  CMP showed no electrolyte abnormality.  LFTs are unremarkable.  Creatinine level is within normal limits.  Urinalysis shows hazy appearance, large leukocytes, no nitrites, she denies any urinary symptoms at this time.  Pregnancy test is negative.  UA was remarkable for some bacteria, large leukocytes, suspect pain is likely coming from her gallbladder, low suspicion for any [place].  Patient is afebrile, denies any urinary symptoms at this time.  Ultrasound of the abdomen was obtained due to her right upper quadrant pain this showed: 1. Cholelithiasis without sonographic evidence for acute  cholecystitis  2. Echogenic liver consistent with steatosis     These results were discussed with patient at length, she did report having an ultrasound prior to her pregnancy which show some gallbladder sludge.  Patient pain has improved after a dose of morphine.  Discussed with her general  surgery follow-up.  Patient's vital signs are within normal limits, she remains afebrile, low suspicion for any cholecystitis.  Patient is encouraged to return to the emergency department if she experiences any fever, back pain.  Patient understands and agrees with management at this time.  Return precautions discussed at length.   Portions of this note were generated with Scientist, clinical (histocompatibility and immunogenetics)Dragon dictation software. Dictation errors may occur despite best attempts at proofreading.   Final Clinical Impressions(s) / ED Diagnoses   Final diagnoses:  Biliary colic  Right upper quadrant abdominal pain    ED Discharge Orders         Ordered    dicyclomine (BENTYL) 20 MG tablet  Daily     09/21/18 2028           Claude MangesSoto, Reather Steller, PA-C 09/21/18 2048    Virgina Norfolkuratolo, Adam, DO 09/21/18 2112

## 2018-09-21 NOTE — ED Triage Notes (Signed)
RUQ pain off and on x 6 months since having a baby per pt.

## 2018-09-21 NOTE — ED Notes (Signed)
Patient transported to Ultrasound 

## 2018-09-21 NOTE — ED Notes (Signed)
ED Provider at bedside. 

## 2018-09-21 NOTE — Discharge Instructions (Addendum)
Your ultrasound today showed multiple stones measuring up to 5 mm, we have discussed the rest of your results.  The number to Dr. Brantley Stage is attached to your chart, please schedule an appointment for follow-up.  If you experience any fever, vomiting, worsening pain please return to the emergency department.

## 2018-09-22 LAB — URINE CULTURE: Culture: <10000 — AB

## 2018-09-23 ENCOUNTER — Other Ambulatory Visit: Payer: Self-pay

## 2018-09-23 ENCOUNTER — Encounter (HOSPITAL_BASED_OUTPATIENT_CLINIC_OR_DEPARTMENT_OTHER): Payer: Self-pay | Admitting: Emergency Medicine

## 2018-09-23 ENCOUNTER — Emergency Department (HOSPITAL_BASED_OUTPATIENT_CLINIC_OR_DEPARTMENT_OTHER)
Admission: EM | Admit: 2018-09-23 | Discharge: 2018-09-23 | Disposition: A | Discharge status: Home / Self Care | Payer: Medicaid Other | Attending: Emergency Medicine | Admitting: Emergency Medicine

## 2018-09-23 DIAGNOSIS — I10 Essential (primary) hypertension: Secondary | ICD-10-CM | POA: Diagnosis not present

## 2018-09-23 DIAGNOSIS — Z79899 Other long term (current) drug therapy: Secondary | ICD-10-CM | POA: Diagnosis not present

## 2018-09-23 DIAGNOSIS — R1011 Right upper quadrant pain: Secondary | ICD-10-CM | POA: Diagnosis present

## 2018-09-23 DIAGNOSIS — Z87891 Personal history of nicotine dependence: Secondary | ICD-10-CM | POA: Diagnosis not present

## 2018-09-23 DIAGNOSIS — K8020 Calculus of gallbladder without cholecystitis without obstruction: Secondary | ICD-10-CM | POA: Insufficient documentation

## 2018-09-23 LAB — CBC WITH DIFFERENTIAL/PLATELET
Abs Immature Granulocytes: 0.03 10*3/uL (ref 0.00–0.07)
Basophils Absolute: 0 10*3/uL (ref 0.0–0.1)
Basophils Relative: 0 %
Eosinophils Absolute: 0.3 10*3/uL (ref 0.0–0.5)
Eosinophils Relative: 3 %
HCT: 38.7 % (ref 36.0–46.0)
Hemoglobin: 12.2 g/dL (ref 12.0–15.0)
Immature Granulocytes: 0 %
Lymphocytes Relative: 18 %
Lymphs Abs: 1.9 10*3/uL (ref 0.7–4.0)
MCH: 28.8 pg (ref 26.0–34.0)
MCHC: 31.5 g/dL (ref 30.0–36.0)
MCV: 91.5 fL (ref 80.0–100.0)
Monocytes Absolute: 0.7 10*3/uL (ref 0.1–1.0)
Monocytes Relative: 7 %
Neutro Abs: 7.5 10*3/uL (ref 1.7–7.7)
Neutrophils Relative %: 72 %
Platelets: 296 10*3/uL (ref 150–400)
RBC: 4.23 MIL/uL (ref 3.87–5.11)
RDW: 14.6 % (ref 11.5–15.5)
WBC: 10.5 10*3/uL (ref 4.0–10.5)
nRBC: 0 % (ref 0.0–0.2)

## 2018-09-23 LAB — COMPREHENSIVE METABOLIC PANEL
ALT: 41 U/L (ref 0–44)
AST: 44 U/L — ABNORMAL HIGH (ref 15–41)
Albumin: 3.4 g/dL — ABNORMAL LOW (ref 3.5–5.0)
Alkaline Phosphatase: 93 U/L (ref 38–126)
Anion gap: 8 (ref 5–15)
BUN: 10 mg/dL (ref 6–20)
CO2: 23 mmol/L (ref 22–32)
Calcium: 8.3 mg/dL — ABNORMAL LOW (ref 8.9–10.3)
Chloride: 106 mmol/L (ref 98–111)
Creatinine, Ser: 0.97 mg/dL (ref 0.44–1.00)
GFR calc Af Amer: >60 mL/min (ref >60)
GFR calc non Af Amer: >60 mL/min (ref >60)
Glucose, Bld: 111 mg/dL — ABNORMAL HIGH (ref 70–99)
Potassium: 4.5 mmol/L (ref 3.5–5.1)
Sodium: 137 mmol/L (ref 135–145)
Total Bilirubin: 0.5 mg/dL (ref 0.3–1.2)
Total Protein: 7.3 g/dL (ref 6.5–8.1)

## 2018-09-23 LAB — LIPASE, BLOOD: Lipase: 28 U/L (ref 11–51)

## 2018-09-23 MED ORDER — HYDROCODONE-ACETAMINOPHEN 5-325 MG PO TABS: 1.0000 | ORAL_TABLET | ORAL | 0 refills | Status: DC | PRN

## 2018-09-23 MED ORDER — SODIUM CHLORIDE 0.9 % IV BOLUS
1000.0000 mL | Freq: Once | INTRAVENOUS | Status: AC
  Administered 2018-09-23: 1000 mL via INTRAVENOUS

## 2018-09-23 MED ORDER — ONDANSETRON HCL 4 MG/2ML IJ SOLN
4.0000 mg | Freq: Once | INTRAMUSCULAR | Status: AC
  Administered 2018-09-23: 4 mg via INTRAVENOUS
  Filled 2018-09-23: qty 2

## 2018-09-23 MED ORDER — MORPHINE SULFATE (PF) 4 MG/ML IV SOLN
4.0000 mg | Freq: Once | INTRAVENOUS | Status: AC
  Administered 2018-09-23: 4 mg via INTRAVENOUS
  Filled 2018-09-23: qty 1

## 2018-09-23 MED ORDER — ONDANSETRON 4 MG PO TBDP: ORAL_TABLET | ORAL | 0 refills | Status: DC

## 2018-09-23 NOTE — ED Provider Notes (Signed)
[facility] EMERGENCY DEPARTMENT Provider Note   CSN: [id number] Arrival date & time: 09/23/18  1347    History   Chief Complaint Chief Complaint  Patient presents with  . Abdominal Pain    HPI [name] Whitney is a 33 y.o. female.     Patient is a 33 year old female who presents with abdominal pain.  [name] Whitney was seen here 2 days ago with right upper quadrant abdominal pain.  [name] Whitney was diagnosed with cholelithiasis on ultrasound.  [name] Whitney was given a prescription for Bentyl.  [name] Whitney states [name] Whitney is having ongoing pain in her right upper abdomen.  [name] Whitney has had associated nausea and nonbilious, nonbloody vomit.  No known fevers.  No urinary symptoms.  [name] Whitney has been taking Bentyl along with ibuprofen and Tylenol without improvement in symptoms.     Past Medical History:  Diagnosis Date  . Bipolar 1 disorder ([facility])   . Depression   . Depression   . Hypertension    due to renal artery stenosis diagnosed by [place] when young per pt  . Irritable bowel syndrome (IBS)   . Migraine headache     Patient Active Problem List   Diagnosis Date Noted  . Panic disorder without agoraphobia 06/14/2011  . Adjustment disorder with anxiety 06/08/2011    History reviewed. No pertinent surgical history.   OB History    Gravida  2   Para  1   Term  1   Preterm  0   AB  0   Living  1     SAB  0   TAB  0   Ectopic  0   Multiple  0   Live Births  1            Home Medications    Prior to Admission medications   Medication Sig Start Date End Date Taking? Authorizing Provider  ALPRAZolam Prudy Feeler(XANAX) 0.5 MG tablet Take 1 tablet (0.5 mg total) by mouth 2 (two) times daily as needed for anxiety. 07/23/16  Yes Sherren MochaShaw, Eva N, MD  cloNIDine (CATAPRES) 0.1 MG tablet Take 1 tablet (0.1 mg total) by mouth 2 (two) times daily. 07/23/16  Yes Sherren MochaShaw, Eva N, MD  dicyclomine (BENTYL) 20 MG tablet Take 1 tablet (20 mg total) by mouth daily for 14 days. 09/21/18 10/05/18 Yes Soto, Johana, PA-C  FLUoxetine  (PROZAC) 40 MG capsule Take 40 mg by mouth daily.   Yes [provider]  omeprazole (PRILOSEC) 20 MG capsule Take 20 mg by mouth 2 (two) times daily before a meal.   Yes [provider]  azithromycin (ZITHROMAX) 250 MG tablet Take 1 tablet (250 mg total) by mouth daily. 1 every day until finished. Patient not taking: Reported on 07/23/2016 03/11/16   Melene PlanFloyd, Dan, DO  citalopram (CELEXA) 20 MG tablet Use 1/2 tab po qd x 2 wks, then 1 tab po qd 07/23/16   Sherren MochaShaw, Eva N, MD  clonazePAM (KLONOPIN) 1 MG tablet Take 1 mg by mouth 3 (three) times daily.    [provider]  doxycycline (VIBRAMYCIN) 100 MG capsule Take 1 capsule (100 mg total) by mouth 2 (two) times daily. One po bid x 7 days 12/03/16   Charlynne PanderYao, David Hsienta, MD  HYDROcodone-acetaminophen (NORCO/VICODIN) 5-325 MG tablet Take 1-2 tablets by mouth every 4 (four) hours as needed. 09/23/18   Rolan BuccoBelfi, Chane Magner, MD  metroNIDAZOLE (FLAGYL) 500 MG tablet Take 1 tablet (500 mg total) by mouth 2 (two) times daily. One po bid x 7 days  12/03/16   Drenda Freeze, MD  ondansetron (ZOFRAN ODT) 4 MG disintegrating tablet 4mg  ODT q4 hours prn nausea/vomit 09/23/18   Malvin Johns, MD  pantoprazole (PROTONIX) 20 MG tablet Take 20 mg by mouth daily.    [provider]  lithium carbonate (LITHOBID) 300 MG CR tablet Take 300 mg by mouth daily.   06/07/11  [provider]    Family History History reviewed. No pertinent family history.  Social History Social History   Tobacco Use  . Smoking status: Former Smoker    Packs/day: 0.50    Years: 3.00    Pack years: 1.50    Types: Cigarettes  . Smokeless tobacco: Never Used  Substance Use Topics  . Alcohol use: Not Currently    Comment: has tasted alcohol age 24 yrs,, no longer uses  . Drug use: Not Currently    Types: Marijuana    Comment: THC in past during High School & pregnancy     Allergies   Cleocin [clindamycin hcl], Compazine, Promethazine hcl, and  Risperidone and related   Review of Systems Review of Systems  Constitutional: Negative for chills, diaphoresis, fatigue and fever.  HENT: Negative for congestion, rhinorrhea and sneezing.   Eyes: Negative.   Respiratory: Negative for cough, chest tightness and shortness of breath.   Cardiovascular: Negative for chest pain and leg swelling.  Gastrointestinal: Positive for abdominal pain, nausea and vomiting. Negative for blood in stool and diarrhea.  Genitourinary: Negative for difficulty urinating, flank pain, frequency and hematuria.  Musculoskeletal: Negative for arthralgias and back pain.  Skin: Negative for rash.  Neurological: Negative for dizziness, speech difficulty, weakness, numbness and headaches.     Physical Exam Updated Vital Signs BP (!) 168/107 (BP Location: Right Arm)   Pulse 80   Temp 98.4 F (36.9 C)   Resp 18   Ht 5\' 8"  (1.727 m)   Wt 106.6 kg   LMP 09/14/2018 (Approximate)   SpO2 100%   BMI 35.73 kg/m   Physical Exam Constitutional:      Appearance: [name] Whitney is well-developed.  HENT:     Head: Normocephalic and atraumatic.  Eyes:     Pupils: Pupils are equal, round, and reactive to light.  Neck:     Musculoskeletal: Normal range of motion and neck supple.  Cardiovascular:     Rate and Rhythm: Normal rate and regular rhythm.     Heart sounds: Normal heart sounds.  Pulmonary:     Effort: Pulmonary effort is normal. No respiratory distress.     Breath sounds: Normal breath sounds. No wheezing or rales.  Chest:     Chest wall: No tenderness.  Abdominal:     General: Bowel sounds are normal.     Palpations: Abdomen is soft.     Tenderness: There is abdominal tenderness in the right upper quadrant. There is no guarding or rebound.  Musculoskeletal: Normal range of motion.  Lymphadenopathy:     Cervical: No cervical adenopathy.  Skin:    General: Skin is warm and dry.     Findings: No rash.  Neurological:     Mental Status: [name] Whitney is alert and  oriented to person, place, and time.      ED Treatments / Results  Labs (all labs ordered are listed, but only abnormal results are displayed) Labs Reviewed  COMPREHENSIVE METABOLIC PANEL - Abnormal; Notable for the following components:      Result Value   Glucose, Bld 111 (*)    Calcium 8.3 (*)  Albumin 3.4 (*)    AST 44 (*)    All other components within normal limits  LIPASE, BLOOD  CBC WITH DIFFERENTIAL/PLATELET    EKG None  Radiology [place] Abdomen Limited Ruq  Result Date: 09/21/2018 CLINICAL DATA:  Right upper quadrant pain EXAM: ULTRASOUND ABDOMEN LIMITED RIGHT UPPER QUADRANT COMPARISON:  Ultrasound 12/24/2017 FINDINGS: Gallbladder: Small stones measuring up to 5 mm. Normal wall thickness. Negative sonographic Murphy Common bile duct: Diameter: 3 mm Liver: Increased hepatic echogenicity. This limits evaluation of the parenchyma. No obvious focal abnormality is seen. Portal vein is patent on color Doppler imaging with normal direction of blood flow towards the liver. IMPRESSION: 1. Cholelithiasis without sonographic evidence for acute cholecystitis 2. Echogenic liver consistent with steatosis Electronically Signed   By: Jasmine PangKim  Fujinaga M.D.   On: 09/21/2018 19:30    Procedures Procedures (including critical care time)  Medications Ordered in ED Medications  morphine 4 MG/ML injection 4 mg (4 mg Intravenous Given 09/23/18 1717)  ondansetron (ZOFRAN) injection 4 mg (4 mg Intravenous Given 09/23/18 1716)  sodium chloride 0.9 % bolus 1,000 mL (0 mLs Intravenous Stopped 09/23/18 1820)  morphine 4 MG/ML injection 4 mg (4 mg Intravenous Given 09/23/18 1849)     Initial Impression / Assessment and Plan / ED Course  I have reviewed the triage vital signs and the nursing notes.  Pertinent labs & imaging results that were available during my care of the patient were reviewed by me and considered in my medical decision making (see chart for details).        Patient is a  33 year old female who presents with pain related to her gallstones.  [name] Whitney has no fever.  Her white count is normal.  Her ALT is minimally elevated but her other LFTs are normal.  There is no suggestions of infection or obstruction.  Her lipase is normal.  Her pain is controlled in the ED.  [name] Whitney did report that [name] Whitney had an egg sandwich and hashbrowns earlier today.  [name] Whitney was requesting about possible admission for cholecystectomy.  [name] Whitney does have an appointment on Wednesday with [facility] but is wondering if [name] Whitney could get her gallbladder out sooner.  I spoke with Dr. Carolynne Edouardoth who felt that it would be more appropriate for her to follow-up as scheduled.  This seems appropriate given that her pain is controlled.  [name] Whitney was discharged home in good condition.  [name] Whitney was given a prescription for Vicodin and Zofran for symptomatically.  [name] Whitney was given strict return precautions if [name] Whitney were develop worsening pain, vomiting, fevers or other worsening symptoms.  [name] Whitney was advised in a strict nonfat diet.  Final Clinical Impressions(s) / ED Diagnoses   Final diagnoses:  Calculus of gallbladder without cholecystitis without obstruction    ED Discharge Orders         Ordered    HYDROcodone-acetaminophen (NORCO/VICODIN) 5-325 MG tablet  Every 4 hours PRN     09/23/18 1851    ondansetron (ZOFRAN ODT) 4 MG disintegrating tablet     09/23/18 1851           Rolan BuccoBelfi, Tyrion Glaude, MD 09/23/18 [id number]

## 2018-09-23 NOTE — ED Notes (Signed)
Pts family member came in to inquire of progress, updated by patient.

## 2018-09-23 NOTE — ED Triage Notes (Signed)
Follow-up for cholecystitis. Pain gotten worse and does not have follow-up until the 1st.

## 2018-09-27 ENCOUNTER — Other Ambulatory Visit: Payer: Self-pay | Admitting: General Surgery

## 2018-10-04 ENCOUNTER — Other Ambulatory Visit (HOSPITAL_COMMUNITY): Payer: Medicaid Other

## 2018-10-10 ENCOUNTER — Emergency Department (HOSPITAL_COMMUNITY): Payer: Medicaid Other

## 2018-10-10 ENCOUNTER — Observation Stay (HOSPITAL_COMMUNITY)
Admission: EM | Admit: 2018-10-10 | Discharge: 2018-10-11 | Disposition: A | Discharge status: Home / Self Care | Payer: Medicaid Other | Attending: Surgery | Admitting: Surgery

## 2018-10-10 ENCOUNTER — Other Ambulatory Visit: Payer: Self-pay

## 2018-10-10 DIAGNOSIS — R1011 Right upper quadrant pain: Secondary | ICD-10-CM | POA: Diagnosis present

## 2018-10-10 DIAGNOSIS — Z1159 Encounter for screening for other viral diseases: Secondary | ICD-10-CM | POA: Insufficient documentation

## 2018-10-10 DIAGNOSIS — Z87891 Personal history of nicotine dependence: Secondary | ICD-10-CM | POA: Insufficient documentation

## 2018-10-10 DIAGNOSIS — I10 Essential (primary) hypertension: Secondary | ICD-10-CM | POA: Diagnosis not present

## 2018-10-10 DIAGNOSIS — K828 Other specified diseases of gallbladder: Secondary | ICD-10-CM | POA: Diagnosis not present

## 2018-10-10 DIAGNOSIS — K760 Fatty (change of) liver, not elsewhere classified: Secondary | ICD-10-CM | POA: Diagnosis not present

## 2018-10-10 DIAGNOSIS — G43909 Migraine, unspecified, not intractable, without status migrainosus: Secondary | ICD-10-CM | POA: Insufficient documentation

## 2018-10-10 DIAGNOSIS — Z881 Allergy status to other antibiotic agents status: Secondary | ICD-10-CM | POA: Insufficient documentation

## 2018-10-10 DIAGNOSIS — K8010 Calculus of gallbladder with chronic cholecystitis without obstruction: Secondary | ICD-10-CM | POA: Diagnosis not present

## 2018-10-10 DIAGNOSIS — F319 Bipolar disorder, unspecified: Secondary | ICD-10-CM | POA: Insufficient documentation

## 2018-10-10 DIAGNOSIS — Z6838 Body mass index (BMI) 38.0-38.9, adult: Secondary | ICD-10-CM | POA: Diagnosis not present

## 2018-10-10 DIAGNOSIS — Z888 Allergy status to other drugs, medicaments and biological substances status: Secondary | ICD-10-CM | POA: Insufficient documentation

## 2018-10-10 DIAGNOSIS — K580 Irritable bowel syndrome with diarrhea: Secondary | ICD-10-CM | POA: Diagnosis not present

## 2018-10-10 DIAGNOSIS — K8020 Calculus of gallbladder without cholecystitis without obstruction: Secondary | ICD-10-CM

## 2018-10-10 DIAGNOSIS — Z79899 Other long term (current) drug therapy: Secondary | ICD-10-CM | POA: Insufficient documentation

## 2018-10-10 DIAGNOSIS — E669 Obesity, unspecified: Secondary | ICD-10-CM | POA: Diagnosis not present

## 2018-10-10 LAB — CBC WITH DIFFERENTIAL/PLATELET
Abs Immature Granulocytes: 0.07 10*3/uL (ref 0.00–0.07)
Basophils Absolute: 0.1 10*3/uL (ref 0.0–0.1)
Basophils Relative: 1 %
Eosinophils Absolute: 0.4 10*3/uL (ref 0.0–0.5)
Eosinophils Relative: 3 %
HCT: 40.6 % (ref 36.0–46.0)
Hemoglobin: 13 g/dL (ref 12.0–15.0)
Immature Granulocytes: 1 %
Lymphocytes Relative: 22 %
Lymphs Abs: 3.3 10*3/uL (ref 0.7–4.0)
MCH: 29 pg (ref 26.0–34.0)
MCHC: 32 g/dL (ref 30.0–36.0)
MCV: 90.4 fL (ref 80.0–100.0)
Monocytes Absolute: 1.2 10*3/uL — ABNORMAL HIGH (ref 0.1–1.0)
Monocytes Relative: 8 %
Neutro Abs: 10.1 10*3/uL — ABNORMAL HIGH (ref 1.7–7.7)
Neutrophils Relative %: 65 %
Platelets: 390 10*3/uL (ref 150–400)
RBC: 4.49 MIL/uL (ref 3.87–5.11)
RDW: 13.9 % (ref 11.5–15.5)
WBC: 15.3 10*3/uL — ABNORMAL HIGH (ref 4.0–10.5)
nRBC: 0 % (ref 0.0–0.2)

## 2018-10-10 LAB — COMPREHENSIVE METABOLIC PANEL
ALT: 68 U/L — ABNORMAL HIGH (ref 0–44)
AST: 59 U/L — ABNORMAL HIGH (ref 15–41)
Albumin: 3.8 g/dL (ref 3.5–5.0)
Alkaline Phosphatase: 97 U/L (ref 38–126)
Anion gap: 13 (ref 5–15)
BUN: 11 mg/dL (ref 6–20)
CO2: 22 mmol/L (ref 22–32)
Calcium: 8.9 mg/dL (ref 8.9–10.3)
Chloride: 102 mmol/L (ref 98–111)
Creatinine, Ser: 1.06 mg/dL — ABNORMAL HIGH (ref 0.44–1.00)
GFR calc Af Amer: >60 mL/min (ref >60)
GFR calc non Af Amer: >60 mL/min (ref >60)
Glucose, Bld: 97 mg/dL (ref 70–99)
Potassium: 4.3 mmol/L (ref 3.5–5.1)
Sodium: 137 mmol/L (ref 135–145)
Total Bilirubin: 0.4 mg/dL (ref 0.3–1.2)
Total Protein: 8.2 g/dL — ABNORMAL HIGH (ref 6.5–8.1)

## 2018-10-10 LAB — I-STAT BETA HCG BLOOD, ED (MC, WL, AP ONLY): I-stat hCG, quantitative: <5 m[IU]/mL (ref <5)

## 2018-10-10 LAB — LIPASE, BLOOD: Lipase: 22 U/L (ref 11–51)

## 2018-10-10 MED ORDER — ONDANSETRON HCL 4 MG/2ML IJ SOLN
4.0000 mg | Freq: Once | INTRAMUSCULAR | Status: AC
  Administered 2018-10-10: 4 mg via INTRAVENOUS
  Filled 2018-10-10: qty 2

## 2018-10-10 MED ORDER — MORPHINE SULFATE (PF) 4 MG/ML IV SOLN
4.0000 mg | Freq: Once | INTRAVENOUS | Status: AC
  Administered 2018-10-10: 4 mg via INTRAVENOUS
  Filled 2018-10-10: qty 1

## 2018-10-10 MED ORDER — SODIUM CHLORIDE 0.9 % IV BOLUS
1000.0000 mL | Freq: Once | INTRAVENOUS | Status: AC
  Administered 2018-10-10: 23:00:00 1000 mL via INTRAVENOUS

## 2018-10-10 NOTE — ED Provider Notes (Signed)
[facility]-EMERGENCY DEPT Provider Note   CSN: [id number] Arrival date & time: 10/10/18  1752    History   Chief Complaint Chief Complaint  Patient presents with  . Cholelithiasis    HPI [name] Whitney is a 33 y.o. female.     HPI   [name] Whitney is a 33 y.o. female, with a history of cholelithiasis, HTN, bipolar, presenting to the ED with abdominal pain worsening over last three days. Pain is RUQ, now constant, aching/sore, 10/10, radiating throughout upper right abdomen and right flank. Accompanied by subjective fever, chills, nausea, vomiting, and diarrhea.   Recent history: Had intermittent RUQ pain for last several months.  Pain worsened, evaluated 09/21/18 in ED, found cholelithiasis, referred to general surgery. Scheduled surgery for 7/24 with Dr. Derrell LollingIngram. Pain changed from intermittent to constant three days ago.  Last ate this morning.  LMP 7/13.  Denies shortness of breath, chest pain, urinary symptoms, cough, or any other complaints.  Past Medical History:  Diagnosis Date  . Bipolar 1 disorder (HCC)   . Depression   . Depression   . Hypertension    due to renal artery stenosis diagnosed by US when young per pt  . Irritable bowel syndrome (IBS)   . Migraine headache     Patient Active Problem List   Diagnosis Date Noted  . Cholelithiasis with cholecystitis 10/11/2018  . Panic disorder without agoraphobia 06/14/2011  . Adjustment disorder with anxiety 06/08/2011    No past surgical history on file.   OB History    Gravida  2   Para  1   Term  1   Preterm  0   AB  0   Living  1     SAB  0   TAB  0   Ectopic  0   Multiple  0   Live Births  1            Home Medications    Prior to Admission medications   Medication Sig Start Date End Date Taking? Authorizing Provider  ALPRAZolam Prudy Feeler(XANAX) 0.5 MG tablet Take 1 tablet (0.5 mg total) by mouth 2 (two) times daily as needed for anxiety. 07/23/16  Yes Sherren MochaShaw, Eva  N, MD  amLODipine (NORVASC) 10 MG tablet Take 10 mg by mouth daily.   Yes [provider]  drospirenone-ethinyl estradiol (YAZ) 3-0.02 MG tablet Take 1 tablet by mouth daily. 08/10/18  Yes [provider]  omeprazole (PRILOSEC) 40 MG capsule Take 40 mg by mouth 2 (two) times a day.  09/21/18  Yes [provider]  azithromycin (ZITHROMAX) 250 MG tablet Take 1 tablet (250 mg total) by mouth daily. 1 every day until finished. Patient not taking: Reported on 07/23/2016 03/11/16   Melene PlanFloyd, Dan, DO  citalopram (CELEXA) 20 MG tablet Use 1/2 tab po qd x 2 wks, then 1 tab po qd Patient not taking: Reported on 10/10/2018 07/23/16   Sherren MochaShaw, Eva N, MD  cloNIDine (CATAPRES) 0.1 MG tablet Take 1 tablet (0.1 mg total) by mouth 2 (two) times daily. Patient not taking: Reported on 10/10/2018 07/23/16   Sherren MochaShaw, Eva N, MD  dicyclomine (BENTYL) 20 MG tablet Take 1 tablet (20 mg total) by mouth daily for 14 days. 09/21/18 10/05/18  Claude MangesSoto, Johana, PA-C  doxycycline (VIBRAMYCIN) 100 MG capsule Take 1 capsule (100 mg total) by mouth 2 (two) times daily. One po bid x 7 days Patient not taking: Reported on 10/10/2018 12/03/16   Charlynne PanderYao, David Hsienta,  MD  HYDROcodone-acetaminophen (NORCO/VICODIN) 5-325 MG tablet Take 1-2 tablets by mouth every 4 (four) hours as needed. Patient not taking: Reported on 10/10/2018 09/23/18   Malvin Johns, MD  metroNIDAZOLE (FLAGYL) 500 MG tablet Take 1 tablet (500 mg total) by mouth 2 (two) times daily. One po bid x 7 days Patient not taking: Reported on 10/10/2018 12/03/16   Drenda Freeze, MD  ondansetron (ZOFRAN ODT) 4 MG disintegrating tablet 4mg  ODT q4 hours prn nausea/vomit Patient not taking: Reported on 10/10/2018 09/23/18   Malvin Johns, MD  lithium carbonate (LITHOBID) 300 MG CR tablet Take 300 mg by mouth daily.   06/07/11  [provider]    Family History No family history on file.  Social History Social History   Tobacco Use  . Smoking status: Former Smoker     Packs/day: 0.50    Years: 3.00    Pack years: 1.50    Types: Cigarettes  . Smokeless tobacco: Never Used  Substance Use Topics  . Alcohol use: Not Currently    Comment: has tasted alcohol age 2 yrs,, no longer uses  . Drug use: Not Currently    Types: Marijuana    Comment: THC in past during High School & pregnancy     Allergies   Cleocin [clindamycin hcl], Compazine, Promethazine hcl, and Risperidone and related   Review of Systems Review of Systems  Constitutional: Positive for chills and fever.  Respiratory: Negative for cough and shortness of breath.   Cardiovascular: Negative for chest pain.  Gastrointestinal: Positive for abdominal pain, diarrhea, nausea and vomiting. Negative for blood in stool.  Genitourinary: Negative for dysuria and hematuria.  All other systems reviewed and are negative.    Physical Exam Updated Vital Signs BP (!) 162/107 (BP Location: Left Arm)   Pulse (!) 115   Temp 98.6 F (37 C)   Resp 18   LMP 10/09/2018   SpO2 100%   Physical Exam Vitals signs and nursing note reviewed.  Constitutional:      General: She is not in acute distress.    Appearance: She is well-developed. She is obese. She is not diaphoretic.  HENT:     Head: Normocephalic and atraumatic.     Mouth/Throat:     Mouth: Mucous membranes are moist.     Pharynx: Oropharynx is clear.  Eyes:     Conjunctiva/sclera: Conjunctivae normal.  Neck:     Musculoskeletal: Neck supple.  Cardiovascular:     Rate and Rhythm: Normal rate and regular rhythm.     Pulses: Normal pulses.          Radial pulses are 2+ on the right side and 2+ on the left side.       Posterior tibial pulses are 2+ on the right side and 2+ on the left side.     Heart sounds: Normal heart sounds.     Comments: Tactile temperature in the extremities appropriate and equal bilaterally. Pulmonary:     Effort: Pulmonary effort is normal. No respiratory distress.     Breath sounds: Normal breath sounds.   Abdominal:     Palpations: Abdomen is soft.     Tenderness: There is abdominal tenderness in the right upper quadrant and epigastric area. There is no guarding. Positive signs include Murphy's sign.  Musculoskeletal:     Right lower leg: No edema.     Left lower leg: No edema.  Lymphadenopathy:     Cervical: No cervical adenopathy.  Skin:  General: Skin is warm and dry.  Neurological:     Mental Status: She is alert.  Psychiatric:        Mood and Affect: Mood and affect normal.        Speech: Speech normal.        Behavior: Behavior normal.      ED Treatments / Results  Labs (all labs ordered are listed, but only abnormal results are displayed) Labs Reviewed  COMPREHENSIVE METABOLIC PANEL - Abnormal; Notable for the following components:      Result Value   Creatinine, Ser 1.06 (*)    Total Protein 8.2 (*)    AST 59 (*)    ALT 68 (*)    All other components within normal limits  CBC WITH DIFFERENTIAL/PLATELET - Abnormal; Notable for the following components:   WBC 15.3 (*)    Neutro Abs 10.1 (*)    Monocytes Absolute 1.2 (*)    All other components within normal limits  SARS CORONAVIRUS 2 (HOSPITAL ORDER, PERFORMED IN [facility])  LIPASE, BLOOD  I-STAT BETA HCG BLOOD, ED ([facility], [facility], [facility] ONLY)    EKG None  Radiology [place] Abdomen Limited Ruq  Result Date: 10/10/2018 CLINICAL DATA:  33 year old female with right upper quadrant pain for 1 month. EXAM: ULTRASOUND ABDOMEN LIMITED RIGHT UPPER QUADRANT COMPARISON:  CT Abdomen and Pelvis 12/03/2016. FINDINGS: Gallbladder: No gallbladder wall thickening, but a 12 millimeter shadowing stone is identified on image 18. No pericholecystic fluid. No sonographic Murphy sign elicited. Common bile duct: Diameter: 5 millimeters, normal. Liver: The liver is dense and difficult to penetrate with heterogeneous echotexture. The liver appears echogenic relative to the right kidney on image 109. No discrete liver lesion. Portal vein  is patent on color Doppler imaging with normal direction of blood flow towards the liver. Other findings: Negative visible right kidney.  No free fluid. IMPRESSION: 1. Evidence of hepatic steatosis, and coarse liver echotexture might indicate chronic hepatocellular disease. 2. Positive 12 mm gallstone, but no evidence of acute cholecystitis or bile duct obstruction. Electronically Signed   By: Odessa FlemingH  Hall M.D.   On: 10/10/2018 22:43    Procedures Procedures (including critical care time)  Medications Ordered in ED Medications  sodium chloride 0.9 % bolus 1,000 mL (0 mLs Intravenous Stopped 10/11/18 0030)  morphine 4 MG/ML injection 4 mg (4 mg Intravenous Given 10/10/18 2237)  ondansetron (ZOFRAN) injection 4 mg (4 mg Intravenous Given 10/10/18 2235)  HYDROmorphone (DILAUDID) injection 1 mg (1 mg Intravenous Given 10/11/18 0028)     Initial Impression / Assessment and Plan / ED Course  I have reviewed the triage vital signs and the nursing notes.  Pertinent labs & imaging results that were available during my care of the patient were reviewed by me and considered in my medical decision making (see chart for details).  Clinical Course as of Oct 10 44  Tue Oct 10, 2018  2245 Rectal  Temp: 100.1 F (37.8 C) [SJ]  Wed Oct 11, 2018  0002 States no improvement in her pain.   [SJ]  0006 Spoke with Dr. Magnus IvanBlackman, general surgeon on call.  Agrees to admit the patient.   [SJ]    Clinical Course User Index [SJ] Joy, Shawn C, PA-C       Patient presents with worsening right upper quadrant pain with subjective fever, nausea, vomiting, and diarrhea. Patient is nontoxic appearing, afebrile here in ED, not tachypneic, not hypotensive, maintains excellent SPO2 on room air. Tachycardic upon arrival.  Leukocytosis of 15.3.  Cholelithiasis without evidence of cholecystitis again noted on ultrasound.  Patient admitted via general surgery for persistent and worsening symptomatic cholelithiasis.     Vitals:   10/10/18 1822 10/10/18 2238 10/10/18 2245  BP: (!) 162/107  (!) 159/121  Pulse: (!) 115  100  Resp: 18  16  Temp: 98.6 F (37 C) 100.1 F (37.8 C)   TempSrc:  Rectal   SpO2: 100%  100%     Final Clinical Impressions(s) / ED Diagnoses   Final diagnoses:  RUQ pain  Calculus of gallbladder without cholecystitis without obstruction    ED Discharge Orders    None       Concepcion LivingJoy, Shawn C, PA-C 10/11/18 0047    Cathren LaineSteinl, Kevin, MD 10/12/18 1535

## 2018-10-10 NOTE — ED Triage Notes (Signed)
Pt has a hx of cholecystitis. Pt reports the pain has gotten worse. Pt reports she is to have her gallbladder out on July 24th, but they were going to try and move the date up and were unable to. Pt reports the pain is 10/10 and that [facility] told her to come to the ED to have it emergently removed.

## 2018-10-11 ENCOUNTER — Observation Stay (HOSPITAL_COMMUNITY): Payer: Medicaid Other | Admitting: Certified Registered Nurse Anesthetist

## 2018-10-11 ENCOUNTER — Other Ambulatory Visit: Payer: Self-pay | Admitting: General Surgery

## 2018-10-11 ENCOUNTER — Encounter (HOSPITAL_COMMUNITY): Payer: Self-pay

## 2018-10-11 ENCOUNTER — Observation Stay (HOSPITAL_COMMUNITY): Payer: Medicaid Other

## 2018-10-11 ENCOUNTER — Encounter (HOSPITAL_COMMUNITY)
Admission: EM | Disposition: A | Discharge status: Home / Self Care | Payer: Self-pay | Source: Home / Self Care | Attending: Emergency Medicine

## 2018-10-11 DIAGNOSIS — I10 Essential (primary) hypertension: Secondary | ICD-10-CM | POA: Diagnosis not present

## 2018-10-11 DIAGNOSIS — K828 Other specified diseases of gallbladder: Secondary | ICD-10-CM | POA: Diagnosis not present

## 2018-10-11 DIAGNOSIS — F319 Bipolar disorder, unspecified: Secondary | ICD-10-CM | POA: Diagnosis not present

## 2018-10-11 DIAGNOSIS — K8010 Calculus of gallbladder with chronic cholecystitis without obstruction: Secondary | ICD-10-CM | POA: Diagnosis not present

## 2018-10-11 HISTORY — PX: CHOLECYSTECTOMY: SHX55

## 2018-10-11 LAB — COMPREHENSIVE METABOLIC PANEL
ALT: 60 U/L — ABNORMAL HIGH (ref 0–44)
AST: 42 U/L — ABNORMAL HIGH (ref 15–41)
Albumin: 3.5 g/dL (ref 3.5–5.0)
Alkaline Phosphatase: 99 U/L (ref 38–126)
Anion gap: 13 (ref 5–15)
BUN: 11 mg/dL (ref 6–20)
CO2: 23 mmol/L (ref 22–32)
Calcium: 8.4 mg/dL — ABNORMAL LOW (ref 8.9–10.3)
Chloride: 103 mmol/L (ref 98–111)
Creatinine, Ser: 1.03 mg/dL — ABNORMAL HIGH (ref 0.44–1.00)
GFR calc Af Amer: >60 mL/min (ref >60)
GFR calc non Af Amer: >60 mL/min (ref >60)
Glucose, Bld: 144 mg/dL — ABNORMAL HIGH (ref 70–99)
Potassium: 4 mmol/L (ref 3.5–5.1)
Sodium: 139 mmol/L (ref 135–145)
Total Bilirubin: 0.4 mg/dL (ref 0.3–1.2)
Total Protein: 7.7 g/dL (ref 6.5–8.1)

## 2018-10-11 LAB — CBC WITH DIFFERENTIAL/PLATELET
Abs Immature Granulocytes: 0.06 10*3/uL (ref 0.00–0.07)
Basophils Absolute: 0.1 10*3/uL (ref 0.0–0.1)
Basophils Relative: 1 %
Eosinophils Absolute: 0.4 10*3/uL (ref 0.0–0.5)
Eosinophils Relative: 4 %
HCT: 40.6 % (ref 36.0–46.0)
Hemoglobin: 12.6 g/dL (ref 12.0–15.0)
Immature Granulocytes: 1 %
Lymphocytes Relative: 21 %
Lymphs Abs: 2.6 10*3/uL (ref 0.7–4.0)
MCH: 28.9 pg (ref 26.0–34.0)
MCHC: 31 g/dL (ref 30.0–36.0)
MCV: 93.1 fL (ref 80.0–100.0)
Monocytes Absolute: 0.8 10*3/uL (ref 0.1–1.0)
Monocytes Relative: 6 %
Neutro Abs: 8.5 10*3/uL — ABNORMAL HIGH (ref 1.7–7.7)
Neutrophils Relative %: 67 %
Platelets: 349 10*3/uL (ref 150–400)
RBC: 4.36 MIL/uL (ref 3.87–5.11)
RDW: 14 % (ref 11.5–15.5)
WBC: 12.4 10*3/uL — ABNORMAL HIGH (ref 4.0–10.5)
nRBC: 0 % (ref 0.0–0.2)

## 2018-10-11 LAB — SURGICAL PCR SCREEN
MRSA, PCR: NEGATIVE
Staphylococcus aureus: NEGATIVE

## 2018-10-11 LAB — SARS CORONAVIRUS 2 BY RT PCR (HOSPITAL ORDER, PERFORMED IN ~~LOC~~ HOSPITAL LAB): SARS Coronavirus 2: NEGATIVE

## 2018-10-11 LAB — HIV ANTIBODY (ROUTINE TESTING W REFLEX): HIV Screen 4th Generation wRfx: NONREACTIVE

## 2018-10-11 LAB — LIPASE, BLOOD: Lipase: 22 U/L (ref 11–51)

## 2018-10-11 SURGERY — LAPAROSCOPIC CHOLECYSTECTOMY WITH INTRAOPERATIVE CHOLANGIOGRAM
Anesthesia: General | Site: Abdomen

## 2018-10-11 MED ORDER — ONDANSETRON HCL 4 MG/2ML IJ SOLN
INTRAMUSCULAR | Status: AC
  Filled 2018-10-11: qty 2

## 2018-10-11 MED ORDER — CHLORHEXIDINE GLUCONATE CLOTH 2 % EX PADS
6.0000 | MEDICATED_PAD | Freq: Once | CUTANEOUS | Status: AC
  Administered 2018-10-11: 6 via TOPICAL

## 2018-10-11 MED ORDER — MIDAZOLAM HCL 5 MG/5ML IJ SOLN
INTRAMUSCULAR | Status: DC | PRN
  Administered 2018-10-11: 2 mg via INTRAVENOUS

## 2018-10-11 MED ORDER — FENTANYL CITRATE (PF) 100 MCG/2ML IJ SOLN
INTRAMUSCULAR | Status: AC
  Filled 2018-10-11: qty 2

## 2018-10-11 MED ORDER — DEXAMETHASONE SODIUM PHOSPHATE 10 MG/ML IJ SOLN
INTRAMUSCULAR | Status: AC
  Filled 2018-10-11: qty 1

## 2018-10-11 MED ORDER — LIDOCAINE 2% (20 MG/ML) 5 ML SYRINGE
INTRAMUSCULAR | Status: DC | PRN
  Administered 2018-10-11: 100 mg via INTRAVENOUS

## 2018-10-11 MED ORDER — HYDRALAZINE HCL 20 MG/ML IJ SOLN
INTRAMUSCULAR | Status: DC | PRN
  Administered 2018-10-11: 5 mg via INTRAVENOUS

## 2018-10-11 MED ORDER — SUGAMMADEX SODIUM 200 MG/2ML IV SOLN
INTRAVENOUS | Status: AC
  Filled 2018-10-11: qty 2

## 2018-10-11 MED ORDER — AMLODIPINE BESYLATE 10 MG PO TABS: 10.0000 mg | ORAL_TABLET | Freq: Every day | ORAL | Status: DC

## 2018-10-11 MED ORDER — ONDANSETRON HCL 4 MG/2ML IJ SOLN
4.0000 mg | Freq: Four times a day (QID) | INTRAMUSCULAR | Status: DC | PRN
  Administered 2018-10-11: 4 mg via INTRAVENOUS

## 2018-10-11 MED ORDER — HYDROMORPHONE HCL 1 MG/ML IJ SOLN
1.0000 mg | Freq: Once | INTRAMUSCULAR | Status: AC
  Administered 2018-10-11: 1 mg via INTRAVENOUS
  Filled 2018-10-11: qty 1

## 2018-10-11 MED ORDER — BUPIVACAINE-EPINEPHRINE (PF) 0.25% -1:200000 IJ SOLN
INTRAMUSCULAR | Status: AC
  Filled 2018-10-11: qty 30

## 2018-10-11 MED ORDER — SODIUM CHLORIDE 0.9 % IV SOLN
INTRAVENOUS | Status: DC | PRN
  Administered 2018-10-11: 8 mL

## 2018-10-11 MED ORDER — ROCURONIUM BROMIDE 10 MG/ML (PF) SYRINGE
PREFILLED_SYRINGE | INTRAVENOUS | Status: AC
  Filled 2018-10-11: qty 10

## 2018-10-11 MED ORDER — POTASSIUM CHLORIDE IN NACL 20-0.9 MEQ/L-% IV SOLN
INTRAVENOUS | Status: DC
  Administered 2018-10-11: 14:00:00 via INTRAVENOUS
  Filled 2018-10-11: qty 1000

## 2018-10-11 MED ORDER — HYDROCODONE-ACETAMINOPHEN 5-325 MG PO TABS: 1.0000 | ORAL_TABLET | ORAL | Status: DC | PRN

## 2018-10-11 MED ORDER — HYDRALAZINE HCL 20 MG/ML IJ SOLN
INTRAMUSCULAR | Status: AC
  Filled 2018-10-11: qty 1

## 2018-10-11 MED ORDER — LIDOCAINE 2% (20 MG/ML) 5 ML SYRINGE
INTRAMUSCULAR | Status: AC
  Filled 2018-10-11: qty 5

## 2018-10-11 MED ORDER — ENOXAPARIN SODIUM 40 MG/0.4ML ~~LOC~~ SOLN: 40.0000 mg | SUBCUTANEOUS | Status: DC

## 2018-10-11 MED ORDER — POTASSIUM CHLORIDE IN NACL 20-0.9 MEQ/L-% IV SOLN
INTRAVENOUS | Status: DC
  Administered 2018-10-11: 03:00:00 via INTRAVENOUS
  Filled 2018-10-11: qty 1000

## 2018-10-11 MED ORDER — CHLORHEXIDINE GLUCONATE CLOTH 2 % EX PADS: 6.0000 | MEDICATED_PAD | Freq: Once | CUTANEOUS | Status: DC

## 2018-10-11 MED ORDER — HYDROMORPHONE HCL 1 MG/ML IJ SOLN
1.0000 mg | INTRAMUSCULAR | Status: DC | PRN
  Administered 2018-10-11 (×4): 1 mg via INTRAVENOUS
  Filled 2018-10-11 (×4): qty 1

## 2018-10-11 MED ORDER — DIPHENHYDRAMINE HCL 25 MG PO CAPS: 25.0000 mg | ORAL_CAPSULE | Freq: Four times a day (QID) | ORAL | Status: DC | PRN

## 2018-10-11 MED ORDER — SODIUM CHLORIDE 0.9 % IV SOLN
2.0000 g | Freq: Every day | INTRAVENOUS | Status: DC
  Administered 2018-10-11: 2 g via INTRAVENOUS
  Filled 2018-10-11: qty 20
  Filled 2018-10-11: qty 2

## 2018-10-11 MED ORDER — ACETAMINOPHEN 500 MG PO TABS
1000.0000 mg | ORAL_TABLET | Freq: Once | ORAL | Status: AC
  Administered 2018-10-11: 10:00:00 1000 mg via ORAL
  Filled 2018-10-11: qty 2

## 2018-10-11 MED ORDER — FENTANYL CITRATE (PF) 250 MCG/5ML IJ SOLN
INTRAMUSCULAR | Status: AC
  Filled 2018-10-11: qty 5

## 2018-10-11 MED ORDER — TRAMADOL HCL 50 MG PO TABS: 50.0000 mg | ORAL_TABLET | Freq: Four times a day (QID) | ORAL | Status: DC | PRN

## 2018-10-11 MED ORDER — DEXAMETHASONE SODIUM PHOSPHATE 4 MG/ML IJ SOLN
INTRAMUSCULAR | Status: DC | PRN
  Administered 2018-10-11: 10 mg via INTRAVENOUS

## 2018-10-11 MED ORDER — OXYCODONE HCL 5 MG PO TABS: 5.0000 mg | ORAL_TABLET | ORAL | Status: DC | PRN

## 2018-10-11 MED ORDER — DIPHENHYDRAMINE HCL 50 MG/ML IJ SOLN: 25.0000 mg | Freq: Four times a day (QID) | INTRAMUSCULAR | Status: DC | PRN

## 2018-10-11 MED ORDER — SODIUM CHLORIDE 0.9 % IR SOLN
Status: DC | PRN
  Administered 2018-10-11: 1000 mL

## 2018-10-11 MED ORDER — LACTATED RINGERS IV SOLN
INTRAVENOUS | Status: AC | PRN
  Administered 2018-10-11: 1000 mL

## 2018-10-11 MED ORDER — OXYCODONE HCL 5 MG PO TABS: 5.0000 mg | ORAL_TABLET | Freq: Four times a day (QID) | ORAL | 0 refills | Status: AC | PRN

## 2018-10-11 MED ORDER — MIDAZOLAM HCL 2 MG/2ML IJ SOLN
INTRAMUSCULAR | Status: AC
  Filled 2018-10-11: qty 2

## 2018-10-11 MED ORDER — LABETALOL HCL 5 MG/ML IV SOLN
INTRAVENOUS | Status: DC | PRN
  Administered 2018-10-11: 7.5 mg via INTRAVENOUS

## 2018-10-11 MED ORDER — SCOPOLAMINE 1 MG/3DAYS TD PT72
1.0000 | MEDICATED_PATCH | TRANSDERMAL | Status: DC
  Administered 2018-10-11: 1.5 mg via TRANSDERMAL

## 2018-10-11 MED ORDER — ONDANSETRON 4 MG PO TBDP: 4.0000 mg | ORAL_TABLET | Freq: Four times a day (QID) | ORAL | Status: DC | PRN

## 2018-10-11 MED ORDER — FENTANYL CITRATE (PF) 100 MCG/2ML IJ SOLN
25.0000 ug | INTRAMUSCULAR | Status: DC | PRN
  Administered 2018-10-11 (×4): 25 ug via INTRAVENOUS

## 2018-10-11 MED ORDER — SUGAMMADEX SODIUM 200 MG/2ML IV SOLN
INTRAVENOUS | Status: DC | PRN
  Administered 2018-10-11: 300 mg via INTRAVENOUS

## 2018-10-11 MED ORDER — SUCCINYLCHOLINE CHLORIDE 200 MG/10ML IV SOSY
PREFILLED_SYRINGE | INTRAVENOUS | Status: AC
  Filled 2018-10-11: qty 10

## 2018-10-11 MED ORDER — ROCURONIUM BROMIDE 10 MG/ML (PF) SYRINGE
PREFILLED_SYRINGE | INTRAVENOUS | Status: DC | PRN
  Administered 2018-10-11: 60 mg via INTRAVENOUS
  Administered 2018-10-11: 10 mg via INTRAVENOUS

## 2018-10-11 MED ORDER — LACTATED RINGERS IV SOLN
INTRAVENOUS | Status: DC
  Administered 2018-10-11: 10:00:00 via INTRAVENOUS

## 2018-10-11 MED ORDER — PROPOFOL 10 MG/ML IV BOLUS
INTRAVENOUS | Status: DC | PRN
  Administered 2018-10-11: 30 mg via INTRAVENOUS
  Administered 2018-10-11: 200 mg via INTRAVENOUS
  Administered 2018-10-11: 70 mg via INTRAVENOUS

## 2018-10-11 MED ORDER — FENTANYL CITRATE (PF) 100 MCG/2ML IJ SOLN
INTRAMUSCULAR | Status: DC | PRN
  Administered 2018-10-11 (×3): 50 ug via INTRAVENOUS
  Administered 2018-10-11: 100 ug via INTRAVENOUS
  Administered 2018-10-11: 50 ug via INTRAVENOUS
  Administered 2018-10-11: 100 ug via INTRAVENOUS

## 2018-10-11 MED ORDER — LABETALOL HCL 5 MG/ML IV SOLN
INTRAVENOUS | Status: AC
  Filled 2018-10-11: qty 4

## 2018-10-11 MED ORDER — SCOPOLAMINE 1 MG/3DAYS TD PT72
MEDICATED_PATCH | TRANSDERMAL | Status: AC
  Filled 2018-10-11: qty 1

## 2018-10-11 SURGICAL SUPPLY — 40 items
APPLIER CLIP 5 13 M/L LIGAMAX5 (MISCELLANEOUS)
APPLIER CLIP ROT 10 11.4 M/L (STAPLE) ×3
CABLE HIGH FREQUENCY MONO STRZ (ELECTRODE) ×3 IMPLANT
CHLORAPREP W/TINT 26 (MISCELLANEOUS) ×3 IMPLANT
CHOLANGIOGRAM CATH TAUT (CATHETERS) ×3 IMPLANT
CLIP APPLIE 5 13 M/L LIGAMAX5 (MISCELLANEOUS) IMPLANT
CLIP APPLIE ROT 10 11.4 M/L (STAPLE) ×1 IMPLANT
CLOSURE WOUND 1/4X4 (GAUZE/BANDAGES/DRESSINGS)
COVER MAYO STAND STRL (DRAPES) ×3 IMPLANT
COVER SURGICAL LIGHT HANDLE (MISCELLANEOUS) ×3 IMPLANT
COVER WAND RF STERILE (DRAPES) IMPLANT
DECANTER SPIKE VIAL GLASS SM (MISCELLANEOUS) ×3 IMPLANT
DERMABOND ADVANCED (GAUZE/BANDAGES/DRESSINGS) ×2
DERMABOND ADVANCED .7 DNX12 (GAUZE/BANDAGES/DRESSINGS) ×1 IMPLANT
DRAPE C-ARM 42X120 X-RAY (DRAPES) ×3 IMPLANT
ELECT REM PT RETURN 15FT ADLT (MISCELLANEOUS) ×3 IMPLANT
GLOVE SURG SIGNA 7.5 PF LTX (GLOVE) ×3 IMPLANT
GOWN STRL REUS W/TWL XL LVL3 (GOWN DISPOSABLE) ×9 IMPLANT
HEMOSTAT SURGICEL 4X8 (HEMOSTASIS) ×3 IMPLANT
IV CATH 14GX2 1/4 (CATHETERS) ×3 IMPLANT
IV SET EXTENSION CATH 6 NF (IV SETS) ×3 IMPLANT
KIT BASIN OR (CUSTOM PROCEDURE TRAY) ×3 IMPLANT
KIT TURNOVER KIT A (KITS) IMPLANT
POUCH RETRIEVAL ECOSAC 10 (ENDOMECHANICALS) ×1 IMPLANT
POUCH RETRIEVAL ECOSAC 10MM (ENDOMECHANICALS) ×2
SCISSORS LAP 5X35 DISP (ENDOMECHANICALS) ×3 IMPLANT
SET IRRIG TUBING LAPAROSCOPIC (IRRIGATION / IRRIGATOR) ×3 IMPLANT
SET TUBE SMOKE EVAC HIGH FLOW (TUBING) ×3 IMPLANT
SLEEVE ADV FIXATION 5X100MM (TROCAR) ×6 IMPLANT
STOPCOCK 4 WAY LG BORE MALE ST (IV SETS) ×3 IMPLANT
STRIP CLOSURE SKIN 1/4X4 (GAUZE/BANDAGES/DRESSINGS) IMPLANT
SUT MNCRL AB 4-0 PS2 18 (SUTURE) ×3 IMPLANT
SYR 10ML ECCENTRIC (SYRINGE) ×3 IMPLANT
TOWEL OR 17X26 10 PK STRL BLUE (TOWEL DISPOSABLE) ×3 IMPLANT
TOWEL OR NON WOVEN STRL DISP B (DISPOSABLE) ×3 IMPLANT
TRAY LAPAROSCOPIC (CUSTOM PROCEDURE TRAY) ×3 IMPLANT
TROCAR ADV FIXATION 11X100MM (TROCAR) IMPLANT
TROCAR ADV FIXATION 5X100MM (TROCAR) ×3 IMPLANT
TROCAR BLADELESS OPT 5 100 (ENDOMECHANICALS) ×3 IMPLANT
TROCAR XCEL BLUNT TIP 100MML (ENDOMECHANICALS) ×3 IMPLANT

## 2018-10-11 NOTE — H&P (Addendum)
[facility] Admission Note  Kennon Holtermily B Takeshita Jul 16, 1985  [id number].     Chief Complaint:  Abdominal pain Reason for Consult: cholecystitis/cholelithiasis   HPI:  Patient is a 33 year old female who presented to [facility] 09/23/18, and was diagnosed with symptomatic cholelithiasis.  She was referred to Dr. Claud KelpHaywood Ingram and was seen on 09/27/2018.  She is continued to have pain almost daily especially after eating; at the time she was seen by Dr. Derrell LollingIngram on 09/27/2018. Abdominal ultrasound in June showed small stones measuring up to 5 mm, normal wall thickness the common bile duct was 3 mm.  No evidence for acute cholecystitis. LFTs were essentially normal except for an AST of 44, lipase 28, WBC 10.5.  During her office visit she was scheduled for laparoscopic cholecystectomy with IOC by Dr. Derrell LollingIngram.  She was sent home with Norco, extremely low-fat diet, and plans to do soon as possible.  Yesterday [facility] with worsening pain right upper quadrant.  Her pain is gone from intermittent to constant. She has had nausea and vomiting with food for 2 days.  Her surgery is scheduled for 10/20/2018.    Work-up in the ED shows she has a low-grade temperature of 100.1.  Blood pressure 159/121.  Heart rate was 100.  BMP shows a glucose of 144, creatinine of 103, AST of 42, ALT of 60, total bilirubin 0.4.  Lipase 22.  WBC 12.4, hemoglobin 12.6, hematocrit 40.6, platelets 349,000.  Her COVID is negative. Abdominal ultrasound obtained last evening shows no gallbladder wall thickening but there is a 12 mm shadowing stone present.  No pericholecystic fluid common bile duct was 5 mm.  There is also evidence of hepatic steatosis.  No evidence of acute cholecystitis or bile duct obstruction.  Results of the studies and exam were called to Dr. Magnus IvanBlackman patient was admitted to the surgery service.  Additional issues include bipolar disorder/depression, BMI of 38,  history of irritable bowel syndrome, history of migraine headaches.  ROS: Review of Systems  Constitutional: Positive for fever and weight loss (not able to eat for the last 2 days).  HENT: Negative.   Eyes: Negative.   Respiratory: Negative.   Cardiovascular: Negative.   Gastrointestinal: Positive for abdominal pain, diarrhea, heartburn, nausea and vomiting. Negative for blood in stool, constipation and melena.  Genitourinary: Negative.   Musculoskeletal: Negative.   Skin: Negative.   Neurological: Negative.   Endo/Heme/Allergies: Negative.   Psychiatric/Behavioral: Negative.     No family history on file.  Past Medical History:  Diagnosis Date  . Bipolar 1 disorder (HCC)   . Depression   . Depression   . Hypertension    due to renal artery stenosis diagnosed by US when young per pt  . Irritable bowel syndrome (IBS)   . Migraine headache     History reviewed. No pertinent surgical history.  Social History:  reports that she has quit smoking. Her smoking use included cigarettes. She has a 1.50 pack-year smoking history. She has never used smokeless tobacco. She reports previous alcohol use. She reports previous drug use. Drug: Marijuana.  Allergies:  Allergies  Allergen Reactions  . Cleocin [Clindamycin Hcl]     Pallor and sweating  . Compazine Other (See Comments)    Muscle spasms  . Promethazine Hcl Other (See Comments)    Muscle spasms  . Risperidone And Related Hives and Swelling    Throat swelling    Medications Prior to Admission  Medication Sig Dispense Refill  . ALPRAZolam (XANAX) 0.5 MG tablet Take 1 tablet (0.5 mg total) by mouth 2 (two) times daily as needed for anxiety. 60 tablet 1  . amLODipine (NORVASC) 10 MG tablet Take 10 mg by mouth daily.    . drospirenone-ethinyl estradiol (YAZ) 3-0.02 MG tablet Take 1 tablet by mouth daily.    Marland Kitchen omeprazole (PRILOSEC) 40 MG capsule Take 40 mg by mouth 2 (two) times a day.     Marland Kitchen azithromycin (ZITHROMAX) 250 MG  tablet Take 1 tablet (250 mg total) by mouth daily. 1 every day until finished. (Patient not taking: Reported on 07/23/2016) 4 tablet 0  . citalopram (CELEXA) 20 MG tablet Use 1/2 tab po qd x 2 wks, then 1 tab po qd (Patient not taking: Reported on 10/10/2018) 90 tablet 0  . cloNIDine (CATAPRES) 0.1 MG tablet Take 1 tablet (0.1 mg total) by mouth 2 (two) times daily. (Patient not taking: Reported on 10/10/2018) 60 tablet 2  . dicyclomine (BENTYL) 20 MG tablet Take 1 tablet (20 mg total) by mouth daily for 14 days. 14 tablet 0  . doxycycline (VIBRAMYCIN) 100 MG capsule Take 1 capsule (100 mg total) by mouth 2 (two) times daily. One po bid x 7 days (Patient not taking: Reported on 10/10/2018) 14 capsule 0  . HYDROcodone-acetaminophen (NORCO/VICODIN) 5-325 MG tablet Take 1-2 tablets by mouth every 4 (four) hours as needed. (Patient not taking: Reported on 10/10/2018) 15 tablet 0  . metroNIDAZOLE (FLAGYL) 500 MG tablet Take 1 tablet (500 mg total) by mouth 2 (two) times daily. One po bid x 7 days (Patient not taking: Reported on 10/10/2018) 14 tablet 0  . ondansetron (ZOFRAN ODT) 4 MG disintegrating tablet 4mg  ODT q4 hours prn nausea/vomit (Patient not taking: Reported on 10/10/2018) 10 tablet 0    Blood pressure (!) 129/92, pulse 86, temperature 98.5 F (36.9 C), temperature source Oral, resp. rate 17, height 5\' 9"  (1.753 m), weight 117.5 kg, last menstrual period 10/09/2018, SpO2 97 %. Physical Exam: Physical Exam Constitutional:      General: She is not in acute distress.    Appearance: Normal appearance. She is obese. She is not ill-appearing, toxic-appearing or diaphoretic.  HENT:     Head: Normocephalic and atraumatic.     Nose: No congestion.     Mouth/Throat:     Mouth: Mucous membranes are moist.     Pharynx: Oropharynx is clear. No oropharyngeal exudate.  Eyes:     General: No scleral icterus.    Conjunctiva/sclera: Conjunctivae normal.     Comments: Pupils are equal  Neck:      Musculoskeletal: Normal range of motion and neck supple. No neck rigidity or muscular tenderness.     Vascular: No carotid bruit.  Cardiovascular:     Rate and Rhythm: Normal rate and regular rhythm.     Pulses: Normal pulses.     Heart sounds: No murmur. No gallop.   Pulmonary:     Effort: Pulmonary effort is normal. No respiratory distress.     Breath sounds: Normal breath sounds. No stridor. No wheezing, rhonchi or rales.  Chest:     Chest wall: No tenderness.  Abdominal:     General: There is no distension.     Palpations: Abdomen is soft. There is no mass.     Tenderness: There is abdominal tenderness. There is no right CVA tenderness, left CVA tenderness, guarding or rebound.     Hernia: No hernia is present.  Musculoskeletal:  General: No swelling or tenderness.  Lymphadenopathy:     Cervical: No cervical adenopathy.  Skin:    General: Skin is warm and dry.     Capillary Refill: Capillary refill takes less than 2 seconds.  Neurological:     General: No focal deficit present.     Mental Status: She is alert and oriented to person, place, and time.     Cranial Nerves: No cranial nerve deficit.  Psychiatric:        Mood and Affect: Mood normal.        Behavior: Behavior normal.        Thought Content: Thought content normal.        Judgment: Judgment normal.     Results for orders placed or performed during the hospital encounter of 10/10/18 (from the past 48 hour(s))  SARS Coronavirus 2 (CEPHEID - Performed in [facility] hospital lab), Hosp Order     Status: None   Collection Time: 10/10/18 10:40 PM   Specimen: Nasopharyngeal Swab  Result Value Ref Range   SARS Coronavirus 2 NEGATIVE NEGATIVE    Comment: (NOTE) If result is NEGATIVE SARS-CoV-2 target nucleic acids are NOT DETECTED. The SARS-CoV-2 RNA is generally detectable in upper and lower  respiratory specimens during the acute phase of infection. The lowest  concentration of SARS-CoV-2 viral copies this  assay can detect is 250  copies / mL. A negative result does not preclude SARS-CoV-2 infection  and should not be used as the sole basis for treatment or other  patient management decisions.  A negative result may occur with  improper specimen collection / handling, submission of specimen other  than nasopharyngeal swab, presence of viral mutation(s) within the  areas targeted by this assay, and inadequate number of viral copies  (<250 copies / mL). A negative result must be combined with clinical  observations, patient history, and epidemiological information. If result is POSITIVE SARS-CoV-2 target nucleic acids are DETECTED. The SARS-CoV-2 RNA is generally detectable in upper and lower  respiratory specimens dur ing the acute phase of infection.  Positive  results are indicative of active infection with SARS-CoV-2.  Clinical  correlation with patient history and other diagnostic information is  necessary to determine patient infection status.  Positive results do  not rule out bacterial infection or co-infection with other viruses. If result is PRESUMPTIVE POSTIVE SARS-CoV-2 nucleic acids MAY BE PRESENT.   A presumptive positive result was obtained on the submitted specimen  and confirmed on repeat testing.  While 2019 novel coronavirus  (SARS-CoV-2) nucleic acids may be present in the submitted sample  additional confirmatory testing may be necessary for epidemiological  and / or clinical management purposes  to differentiate between  SARS-CoV-2 and other Sarbecovirus currently known to infect humans.  If clinically indicated additional testing with an alternate test  methodology [phone]) is advised. The SARS-CoV-2 RNA is generally  detectable in upper and lower respiratory sp ecimens during the acute  phase of infection. The expected result is Negative. Fact Sheet for Patients:  BoilerBrush.com.cy Fact Sheet for Healthcare  Providers: https://pope.com/ This test is not yet approved or cleared by the [place] FDA and has been authorized for detection and/or diagnosis of SARS-CoV-2 by FDA under an Emergency Use Authorization (EUA).  This EUA will remain in effect (meaning this test can be used) for the duration of the COVID-19 declaration under Section 564(b)(1) of the Act, 21 U.S.C. section 360bbb-3(b)(1), unless the authorization is terminated or revoked sooner. Performed  at [facility], [address], [place], [place] [address]   Comprehensive metabolic panel     Status: Abnormal   Collection Time: 10/10/18 10:59 PM  Result Value Ref Range   Sodium 137 135 - 145 mmol/L   Potassium 4.3 3.5 - 5.1 mmol/L   Chloride 102 98 - 111 mmol/L   CO2 22 22 - 32 mmol/L   Glucose, Bld 97 70 - 99 mg/dL   BUN 11 6 - 20 mg/dL   Creatinine, Ser 9.56 (H) 0.44 - 1.00 mg/dL   Calcium 8.9 8.9 - 21.3 mg/dL   Total Protein 8.2 (H) 6.5 - 8.1 g/dL   Albumin 3.8 3.5 - 5.0 g/dL   AST 59 (H) 15 - 41 U/L   ALT 68 (H) 0 - 44 U/L   Alkaline Phosphatase 97 38 - 126 U/L   Total Bilirubin 0.4 0.3 - 1.2 mg/dL   GFR calc non Af Amer >60 >60 mL/min   GFR calc Af Amer >60 >60 mL/min   Anion gap 13 5 - 15    Comment: Performed at [facility], [address], [place], [place] [address]  Lipase, blood     Status: None   Collection Time: 10/10/18 10:59 PM  Result Value Ref Range   Lipase 22 11 - 51 U/L    Comment: Performed at [facility], [address] [place], [place] [address]  CBC with Differential     Status: Abnormal   Collection Time: 10/10/18 10:59 PM  Result Value Ref Range   WBC 15.3 (H) 4.0 - 10.5 K/uL   RBC 4.49 3.87 - 5.11 MIL/uL   Hemoglobin 13.0 12.0 - 15.0 g/dL   HCT 29.5 28.4 - 13.2 %   MCV 90.4 80.0 - 100.0 fL   MCH 29.0 26.0 - 34.0 pg   MCHC 32.0 30.0 - 36.0 g/dL   RDW 44.0 10.2 - 72.5 %   Platelets 390 150 - 400 K/uL    nRBC 0.0 0.0 - 0.2 %   Neutrophils Relative % 65 %   Neutro Abs 10.1 (H) 1.7 - 7.7 K/uL   Lymphocytes Relative 22 %   Lymphs Abs 3.3 0.7 - 4.0 K/uL   Monocytes Relative 8 %   Monocytes Absolute 1.2 (H) 0.1 - 1.0 K/uL   Eosinophils Relative 3 %   Eosinophils Absolute 0.4 0.0 - 0.5 K/uL   Basophils Relative 1 %   Basophils Absolute 0.1 0.0 - 0.1 K/uL   Immature Granulocytes 1 %   Abs Immature Granulocytes 0.07 0.00 - 0.07 K/uL    Comment: Performed at [facility], [address], [place], [place] [address]  I-Stat beta hCG blood, ED     Status: None   Collection Time: 10/10/18 11:39 PM  Result Value Ref Range   I-stat hCG, quantitative <5.0 <5 mIU/mL   Comment 3            Comment:   GEST. AGE      CONC.  (mIU/mL)   <=1 WEEK        5 - 50     2 WEEKS       50 - 500     3 WEEKS       100 - 10,000     4 WEEKS     1,000 - 30,000        FEMALE AND NON-PREGNANT FEMALE:     LESS THAN 5 mIU/mL   Surgical  pcr screen     Status: None   Collection Time: 10/11/18  2:29 AM   Specimen: Nasal Mucosa; Nasal Swab  Result Value Ref Range   MRSA, PCR NEGATIVE NEGATIVE   Staphylococcus aureus NEGATIVE NEGATIVE    Comment: (NOTE) The Xpert SA Assay (FDA approved for NASAL specimens in patients 33 years of age and older), is one component of a comprehensive surveillance program. It is not intended to diagnose infection nor to guide or monitor treatment. Performed at [facility], [address] [place], [place] [address]   CBC WITH DIFFERENTIAL     Status: Abnormal   Collection Time: 10/11/18  4:03 AM  Result Value Ref Range   WBC 12.4 (H) 4.0 - 10.5 K/uL   RBC 4.36 3.87 - 5.11 MIL/uL   Hemoglobin 12.6 12.0 - 15.0 g/dL   HCT 60.440.6 54.036.0 - 98.146.0 %   MCV 93.1 80.0 - 100.0 fL   MCH 28.9 26.0 - 34.0 pg   MCHC 31.0 30.0 - 36.0 g/dL   RDW 19.114.0 47.811.5 - 29.515.5 %   Platelets 349 150 - 400 K/uL   nRBC 0.0 0.0 - 0.2 %   Neutrophils Relative % 67 %   Neutro Abs  8.5 (H) 1.7 - 7.7 K/uL   Lymphocytes Relative 21 %   Lymphs Abs 2.6 0.7 - 4.0 K/uL   Monocytes Relative 6 %   Monocytes Absolute 0.8 0.1 - 1.0 K/uL   Eosinophils Relative 4 %   Eosinophils Absolute 0.4 0.0 - 0.5 K/uL   Basophils Relative 1 %   Basophils Absolute 0.1 0.0 - 0.1 K/uL   Immature Granulocytes 1 %   Abs Immature Granulocytes 0.06 0.00 - 0.07 K/uL    Comment: Performed at [facility], [address] [place], [place] [address]  Comprehensive metabolic panel     Status: Abnormal   Collection Time: 10/11/18  4:03 AM  Result Value Ref Range   Sodium 139 135 - 145 mmol/L   Potassium 4.0 3.5 - 5.1 mmol/L   Chloride 103 98 - 111 mmol/L   CO2 23 22 - 32 mmol/L   Glucose, Bld 144 (H) 70 - 99 mg/dL   BUN 11 6 - 20 mg/dL   Creatinine, Ser 8.651.03 (H) 0.44 - 1.00 mg/dL   Calcium 8.4 (L) 8.9 - 10.3 mg/dL   Total Protein 7.7 6.5 - 8.1 g/dL   Albumin 3.5 3.5 - 5.0 g/dL   AST 42 (H) 15 - 41 U/L   ALT 60 (H) 0 - 44 U/L   Alkaline Phosphatase 99 38 - 126 U/L   Total Bilirubin 0.4 0.3 - 1.2 mg/dL   GFR calc non Af Amer >60 >60 mL/min   GFR calc Af Amer >60 >60 mL/min   Anion gap 13 5 - 15    Comment: Performed at [facility], [address] [place], [place] [address]  Lipase, blood     Status: None   Collection Time: 10/11/18  4:03 AM  Result Value Ref Range   Lipase 22 11 - 51 U/L    Comment: Performed at [facility], [address] [place], [place] [address]   [place] Abdomen Limited Ruq  Result Date: 10/10/2018 CLINICAL DATA:  33 year old female with right upper quadrant pain for 1 month. EXAM: ULTRASOUND ABDOMEN LIMITED RIGHT UPPER QUADRANT COMPARISON:  CT Abdomen and Pelvis 12/03/2016. FINDINGS: Gallbladder: No gallbladder wall thickening, but a 12 millimeter shadowing stone is identified on  image 18. No pericholecystic fluid. No sonographic Murphy sign elicited. Common bile duct: Diameter: 5 millimeters, normal. Liver: The  liver is dense and difficult to penetrate with heterogeneous echotexture. The liver appears echogenic relative to the right kidney on image 109. No discrete liver lesion. Portal vein is patent on color Doppler imaging with normal direction of blood flow towards the liver. Other findings: Negative visible right kidney.  No free fluid. IMPRESSION: 1. Evidence of hepatic steatosis, and coarse liver echotexture might indicate chronic hepatocellular disease. 2. Positive 12 mm gallstone, but no evidence of acute cholecystitis or bile duct obstruction. Electronically Signed   By: Odessa FlemingH  Hall M.D.   On: 10/10/2018 22:43   . 0.9 % NaCl with KCl 20 mEq / L 125 mL/hr at 10/11/18 0243  . cefTRIAXone (ROCEPHIN)  IV 2 g (10/11/18 0255)     Assessment/Plan Postpartum x 7 months Hx bipolar disorder/depression Hypertension Hx migraine headaches Hx of IBD BMI 38   Symptomatic cholelithiasis/cholecystitis  FEN: N.p.o./IV fluids ID: Rocephin DVT: SCDs Follow-up: [facility] POC: Follmer,Steven Father [phone]    Plan: Patient's been admitted.  She has been seen by Dr. Ezzard StandingNewman.    We plan laparoscopic cholecystectomy this afternoon.  Dr. Ezzard StandingNewman has reviewed the risk and benefits and discussed plan surgery with her father.  [facility]enn[facility] PA-C  [facility] Pager:  [phone]   10/11/2018 7:03 AM  Agree with above. I got the father on the phone and reviewed the plans with him and the patient.  Ovidio Kinavid Shakiyah Cirilo, MD, [facility] Pager: [phone] Office phone:  [phone]

## 2018-10-11 NOTE — Anesthesia Procedure Notes (Signed)
Procedure Name: Intubation Date/Time: 10/11/2018 10:34 AM Performed by: Claudia Desanctis, CRNA Pre-anesthesia Checklist: Patient identified, Emergency Drugs available, Suction available and Patient being monitored Patient Re-evaluated:Patient Re-evaluated prior to induction Oxygen Delivery Method: Circle system utilized Preoxygenation: Pre-oxygenation with 100% oxygen Induction Type: IV induction Ventilation: Mask ventilation without difficulty Laryngoscope Size: 2 and Miller Grade View: Grade I Tube type: Oral Tube size: 7.5 mm Number of attempts: 1 Airway Equipment and Method: Stylet Placement Confirmation: ETT inserted through vocal cords under direct vision,  positive ETCO2 and breath sounds checked- equal and bilateral Secured at: 8 cm Tube secured with: Tape Dental Injury: Teeth and Oropharynx as per pre-operative assessment

## 2018-10-11 NOTE — Anesthesia Preprocedure Evaluation (Addendum)
Anesthesia Evaluation  Patient identified by MRN, date of birth, ID band Patient awake    Reviewed: Allergy & Precautions, NPO status , Patient's Chart, lab work & pertinent test results  Airway Mallampati: II  TM Distance: >3 FB Neck ROM: Full    Dental no notable dental hx. (+) Teeth Intact, Dental Advisory Given,    Pulmonary neg pulmonary ROS, former smoker,    Pulmonary exam normal breath sounds clear to auscultation       Cardiovascular hypertension, Pt. on medications Normal cardiovascular exam Rhythm:Regular Rate:Normal     Neuro/Psych  Headaches, PSYCHIATRIC DISORDERS Anxiety Depression Bipolar Disorder    GI/Hepatic Neg liver ROS, GERD  Medicated,  Endo/Other  negative endocrine ROS  Renal/GU negative Renal ROS  negative genitourinary   Musculoskeletal negative musculoskeletal ROS (+)   Abdominal   Peds negative pediatric ROS (+)  Hematology negative hematology ROS (+)   Anesthesia Other Findings cholecystitis  Reproductive/Obstetrics negative OB ROS                            Anesthesia Physical Anesthesia Plan  ASA: III  Anesthesia Plan: General   Post-op Pain Management:    Induction: Intravenous  PONV Risk Score and Plan: 3 and Midazolam, Dexamethasone, Ondansetron and Scopolamine patch - Pre-op  Airway Management Planned: Oral ETT  Additional Equipment:   Intra-op Plan:   Post-operative Plan: Extubation in OR  Informed Consent: I have reviewed the patients History and Physical, chart, labs and discussed the procedure including the risks, benefits and alternatives for the proposed anesthesia with the patient or authorized representative who has indicated his/her understanding and acceptance.     Dental advisory given  Plan Discussed with: CRNA  Anesthesia Plan Comments:        Anesthesia Quick Evaluation

## 2018-10-11 NOTE — Discharge Instructions (Signed)
[facility] ______[facility] °LAPAROSCOPIC SURGERY: POST OP INSTRUCTIONS °Always review your discharge instruction sheet given to you by the facility where your surgery was performed. °IF YOU HAVE DISABILITY OR FAMILY LEAVE FORMS, YOU MUST BRING THEM TO THE OFFICE FOR PROCESSING.   °DO NOT GIVE THEM TO YOUR DOCTOR. ° °1. A prescription for pain medication may be given to you upon discharge.  Take your pain medication as prescribed, if needed.  If narcotic pain medicine is not needed, then you may take acetaminophen (Tylenol) or ibuprofen (Advil) as needed. °2. Take your usually prescribed medications unless otherwise directed. °3. If you need a refill on your pain medication, please contact your pharmacy.  They will contact our office to request authorization. Prescriptions will not be filled after 5pm or on week-ends. °4. You should follow a light diet the first few days after arrival home, such as soup and crackers, etc.  Be sure to include lots of fluids daily. °5. Most patients will experience some swelling and bruising in the area of the incisions.  Ice packs will help.  Swelling and bruising can take several days to resolve.  °6. It is common to experience some constipation if taking pain medication after surgery.  Increasing fluid intake and taking a stool softener (such as Colace) will usually help or prevent this problem from occurring.  A mild laxative (Milk of Magnesia or Miralax) should be taken according to package instructions if there are no bowel movements after 48 hours. °7. Unless discharge instructions indicate otherwise, you may remove your bandages 24-48 hours after surgery, and you may shower at that time.  You may have steri-strips (small skin tapes) in place directly over the incision.  These strips should be left on the skin for 7-10 days.  If your surgeon used skin glue on the incision, you may shower in 24 hours.  The glue will flake off over the next 2-3 weeks.  Any sutures or  staples will be removed at the office during your follow-up visit. °8. ACTIVITIES:  You may resume regular (light) daily activities beginning the next day--such as daily self-care, walking, climbing stairs--gradually increasing activities as tolerated.  You may have sexual intercourse when it is comfortable.  Refrain from any heavy lifting or straining until approved by your doctor. °a. You may drive when you are no longer taking prescription pain medication, you can comfortably wear a seatbelt, and you can safely maneuver your car and apply brakes. °b. RETURN TO WORK:  __________________________________________________________ °9. You should see your doctor in the office for a follow-up appointment approximately 2-3 weeks after your surgery.  Make sure that you call for this appointment within a day or two after you arrive home to insure a convenient appointment time. °10. OTHER INSTRUCTIONS: __________________________________________________________________________________________________________________________ __________________________________________________________________________________________________________________________ °WHEN TO CALL YOUR DOCTOR: °1. Fever over 101.0 °2. Inability to urinate °3. Continued bleeding from incision. °4. Increased pain, redness, or drainage from the incision. °5. Increasing abdominal pain ° °The clinic staff is available to answer your questions during regular business hours.  Please don’t hesitate to call and ask to speak to one of the nurses for clinical concerns.  If you have a medical emergency, go to the nearest emergency room or call 911.  A surgeon from [facility] is always on call at the hospital. °[address], [address], [place], [place]  [address] ? [address] [address], [place], [place]   [address] °[phone] ? [phone] ? FAX [phone] °Web site:   [email or web address] ° ° °Gallbladder Eating Plan °If you have a gallbladder  condition, you may have trouble digesting fats. Eating a low-fat diet can help reduce your symptoms, and may be helpful before and after having surgery to remove your gallbladder (cholecystectomy). Your health care provider may recommend that you work with a diet and nutrition specialist (dietitian) to help you reduce the amount of fat in your diet. °What are tips for following this plan? °General guidelines °· Limit your fat intake to less than 30% of your total daily calories. If you eat around 1,800 calories each day, this is less than 60 grams (g) of fat per day. °· Fat is an important part of a healthy diet. Eating a low-fat diet can make it hard to maintain a healthy body weight. Ask your dietitian how much fat, calories, and other nutrients you need each day. °· Eat small, frequent meals throughout the day instead of three large meals. °· Drink at least 8-10 cups of fluid a day. Drink enough fluid to keep your urine clear or pale yellow. °· Limit alcohol intake to no more than 1 drink a day for nonpregnant women and 2 drinks a day for men. One drink equals 12 oz of beer, 5 oz of wine, or 1½ oz of hard liquor. °Reading food labels °· Check Nutrition Facts on food labels for the amount of fat per serving. Choose foods with less than 3 grams of fat per serving. °Shopping °· Choose nonfat and low-fat healthy foods. Look for the words “nonfat,” “low fat,” or “fat free.” °· Avoid buying processed or prepackaged foods. °Cooking °· Cook using low-fat methods, such as baking, broiling, grilling, or boiling. °· Cook with small amounts of healthy fats, such as olive oil, grapeseed oil, canola oil, or sunflower oil. °What foods are recommended? ° °· All fresh, frozen, or canned fruits and vegetables. °· Whole grains. °· Low-fat or non-fat (skim) milk and yogurt. °· Lean meat, skinless poultry, fish, eggs, and beans. °· Low-fat protein supplement powders or drinks. °· Spices and herbs. °What foods are not  recommended? °· High-fat foods. These include baked goods, fast food, fatty cuts of meat, ice cream, french toast, sweet rolls, pizza, cheese bread, foods covered with butter, creamy sauces, or cheese. °· Fried foods. These include french fries, tempura, battered fish, breaded chicken, fried breads, and sweets. °· Foods with strong odors. °· Foods that cause bloating and gas. °Summary °· A low-fat diet can be helpful if you have a gallbladder condition, or before and after gallbladder surgery. °· Limit your fat intake to less than 30% of your total daily calories. This is about 60 g of fat if you eat 1,800 calories each day. °· Eat small, frequent meals throughout the day instead of three large meals. °This information is not intended to replace advice given to you by your health care provider. Make sure you discuss any questions you have with your health care provider. °Document Released: 03/20/2013 Document Revised: 07/06/2018 Document Reviewed: 04/22/2016 °Elsevier Patient Education © 2020 [employer] Inc. ° °

## 2018-10-11 NOTE — Progress Notes (Addendum)
Reviewed records and examined patient.  She's had symptomatic cholelithiasis, which has become worse.  She saw my partner, Dr. Dalbert Batman, who had set up cholecystectomy for in her about a week.  But she's continued to have pain and came to the [facility] ER last night.  She has a 12 mm gallstone, mildly elevated WBC, and mildly elevated LFT's.  Will proceed with lap cholecystectomy today.  I discussed with the patient the indications and risks of gall bladder surgery.  The primary risks of gall bladder surgery include, but are not limited to, bleeding, infection, common bile duct injury, and open surgery.  There is also the risk that the patient may have continued symptoms after surgery.  We discussed the typical post-operative recovery course. I tried to answer the patient's questions.  I spoke to her father, Reyonna Haack, on the phone. 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  She last worked over a year ago.  She has a 24 month old son and a 41 yo son (who lives with his father).  She has no PCP - sees Dr. Brigid Re at [place]. She has a history of bipolar disease, but does not actively see a psychiatrist.  Alphonsa Overall, MD, [facility] Pager: [phone] Office phone:  [phone]

## 2018-10-11 NOTE — ED Notes (Signed)
ED TO INPATIENT HANDOFF REPORT  ED Nurse Name and Phone #: Candyce ChurnZachary Brinlynn Gorton  S Name/Age/Gender [name] HolterEmily B Whitney 33 y.o. female Room/Bed: WA22/WA22  Code Status   Code Status: Prior  Home/SNF/Other given to floor Patient oriented to: self, place, time and situation Is this baseline? Yes   Triage Complete: Triage complete  Chief Complaint abd pain; gallbladder  Triage Note Pt has a hx of cholecystitis. Pt reports the pain has gotten worse. Pt reports she is to have her gallbladder out on July 24th, but they were going to try and move the date up and were unable to. Pt reports the pain is 10/10 and that [facility] told her to come to the ED to have it emergently removed.   Allergies Allergies  Allergen Reactions  . Cleocin [Clindamycin Hcl]     Pallor and sweating  . Compazine Other (See Comments)    Muscle spasms  . Promethazine Hcl Other (See Comments)    Muscle spasms  . Risperidone And Related Hives and Swelling    Throat swelling    Level of Care/Admitting Diagnosis ED Disposition    ED Disposition Condition Comment   Admit  [facility] [[id number]]  Level of Care: Med-Surg [16]  Covid Evaluation: Person Under Investigation (PUI)  Diagnosis: Cholelithiasis with cholecystitis [161096][171925]  Admitting Physician: Abigail MiyamotoBLACKMAN, DOUGLAS [[id number]]  Attending Physician: CCS, MD [[id number]]  PT Class (Do Not Modify): Observation [104]  PT Acc Code (Do Not Modify): Observation [10022]       B Medical/Surgery History Past Medical History:  Diagnosis Date  . Bipolar 1 disorder (HCC)   . Depression   . Depression   . Hypertension    due to renal artery stenosis diagnosed by US when young per pt  . Irritable bowel syndrome (IBS)   . Migraine headache    No past surgical history on file.   A IV Location/Drains/Wounds Patient Lines/Drains/Airways Status   Active Line/Drains/Airways    Name:   Placement date:   Placement time:   Site:    Days:   Peripheral IV 09/23/18 Left Antecubital   09/23/18    1621    Antecubital   18   Peripheral IV 10/10/18 Right Hand   10/10/18    2235    Hand   1          Intake/Output Last 24 hours  Intake/Output Summary (Last 24 hours) at 10/11/2018 0048 Last data filed at 10/11/2018 0030 Gross per 24 hour  Intake 1000 ml  Output -  Net 1000 ml    Labs/Imaging Results for orders placed or performed during the hospital encounter of 10/10/18 (from the past 48 hour(s))  SARS Coronavirus 2 (CEPHEID - Performed in [facility] hospital lab), Hosp Order     Status: None   Collection Time: 10/10/18 10:40 PM   Specimen: Nasopharyngeal Swab  Result Value Ref Range   SARS Coronavirus 2 NEGATIVE NEGATIVE    Comment: (NOTE) If result is NEGATIVE SARS-CoV-2 target nucleic acids are NOT DETECTED. The SARS-CoV-2 RNA is generally detectable in upper and lower  respiratory specimens during the acute phase of infection. The lowest  concentration of SARS-CoV-2 viral copies this assay can detect is 250  copies / mL. A negative result does not preclude SARS-CoV-2 infection  and should not be used as the sole basis for treatment or other  patient management decisions.  A negative result may occur with  improper specimen collection / handling, submission  of specimen other  than nasopharyngeal swab, presence of viral mutation(s) within the  areas targeted by this assay, and inadequate number of viral copies  (<250 copies / mL). A negative result must be combined with clinical  observations, patient history, and epidemiological information. If result is POSITIVE SARS-CoV-2 target nucleic acids are DETECTED. The SARS-CoV-2 RNA is generally detectable in upper and lower  respiratory specimens dur ing the acute phase of infection.  Positive  results are indicative of active infection with SARS-CoV-2.  Clinical  correlation with patient history and other diagnostic information is  necessary to determine  patient infection status.  Positive results do  not rule out bacterial infection or co-infection with other viruses. If result is PRESUMPTIVE POSTIVE SARS-CoV-2 nucleic acids MAY BE PRESENT.   A presumptive positive result was obtained on the submitted specimen  and confirmed on repeat testing.  While 2019 novel coronavirus  (SARS-CoV-2) nucleic acids may be present in the submitted sample  additional confirmatory testing may be necessary for epidemiological  and / or clinical management purposes  to differentiate between  SARS-CoV-2 and other Sarbecovirus currently known to infect humans.  If clinically indicated additional testing with an alternate test  methodology [phone]([id number]) is advised. The SARS-CoV-2 RNA is generally  detectable in upper and lower respiratory sp ecimens during the acute  phase of infection. The expected result is Negative. Fact Sheet for Patients:  BoilerBrush.com.cyhttps://www.fda.gov/media/136312/download Fact Sheet for Healthcare Providers: https://pope.com/https://www.fda.gov/media/136313/download This test is not yet approved or cleared by the [place] FDA and has been authorized for detection and/or diagnosis of SARS-CoV-2 by FDA under an Emergency Use Authorization (EUA).  This EUA will remain in effect (meaning this test can be used) for the duration of the COVID-19 declaration under Section 564(b)(1) of the Act, 21 U.S.C. section 360bbb-3(b)(1), unless the authorization is terminated or revoked sooner. Performed at [facility], [address] [place], [place] [address]   Comprehensive metabolic panel     Status: Abnormal   Collection Time: 10/10/18 10:59 PM  Result Value Ref Range   Sodium 137 135 - 145 mmol/L   Potassium 4.3 3.5 - 5.1 mmol/L   Chloride 102 98 - 111 mmol/L   CO2 22 22 - 32 mmol/L   Glucose, Bld 97 70 - 99 mg/dL   BUN 11 6 - 20 mg/dL   Creatinine, Ser 8.111.06 (H) 0.44 - 1.00 mg/dL   Calcium 8.9 8.9 - 91.410.3 mg/dL   Total Protein 8.2 (H) 6.5 -  8.1 g/dL   Albumin 3.8 3.5 - 5.0 g/dL   AST 59 (H) 15 - 41 U/L   ALT 68 (H) 0 - 44 U/L   Alkaline Phosphatase 97 38 - 126 U/L   Total Bilirubin 0.4 0.3 - 1.2 mg/dL   GFR calc non Af Amer >60 >60 mL/min   GFR calc Af Amer >60 >60 mL/min   Anion gap 13 5 - 15    Comment: Performed at [facility], [address] [place], [place] [address]  Lipase, blood     Status: None   Collection Time: 10/10/18 10:59 PM  Result Value Ref Range   Lipase 22 11 - 51 U/L    Comment: Performed at [facility], [address] [place], [place] [address]  CBC with Differential     Status: Abnormal   Collection Time: 10/10/18 10:59 PM  Result Value Ref Range   WBC 15.3 (H) 4.0 - 10.5 K/uL   RBC  4.49 3.87 - 5.11 MIL/uL   Hemoglobin 13.0 12.0 - 15.0 g/dL   HCT 40.6 36.0 - 46.0 %   MCV 90.4 80.0 - 100.0 fL   MCH 29.0 26.0 - 34.0 pg   MCHC 32.0 30.0 - 36.0 g/dL   RDW 13.9 11.5 - 15.5 %   Platelets 390 150 - 400 K/uL   nRBC 0.0 0.0 - 0.2 %   Neutrophils Relative % 65 %   Neutro Abs 10.1 (H) 1.7 - 7.7 K/uL   Lymphocytes Relative 22 %   Lymphs Abs 3.3 0.7 - 4.0 K/uL   Monocytes Relative 8 %   Monocytes Absolute 1.2 (H) 0.1 - 1.0 K/uL   Eosinophils Relative 3 %   Eosinophils Absolute 0.4 0.0 - 0.5 K/uL   Basophils Relative 1 %   Basophils Absolute 0.1 0.0 - 0.1 K/uL   Immature Granulocytes 1 %   Abs Immature Granulocytes 0.07 0.00 - 0.07 K/uL    Comment: Performed at [facility], [place] [address] [place], [place] [address]  I-Stat beta hCG blood, ED     Status: None   Collection Time: 10/10/18 11:39 PM  Result Value Ref Range   I-stat hCG, quantitative <5.0 <5 mIU/mL   Comment 3            Comment:   GEST. AGE      CONC.  (mIU/mL)   <=1 WEEK        5 - 50     2 WEEKS       50 - 500     3 WEEKS       100 - 10,000     4 WEEKS     1,000 - 30,000        FEMALE AND NON-PREGNANT FEMALE:     LESS THAN 5 mIU/mL    US Abdomen Limited  Ruq  Result Date: 10/10/2018 CLINICAL DATA:  33 year old female with right upper quadrant pain for 1 month. EXAM: ULTRASOUND ABDOMEN LIMITED RIGHT UPPER QUADRANT COMPARISON:  CT Abdomen and Pelvis 12/03/2016. FINDINGS: Gallbladder: No gallbladder wall thickening, but a 12 millimeter shadowing stone is identified on image 18. No pericholecystic fluid. No sonographic Murphy sign elicited. Common bile duct: Diameter: 5 millimeters, normal. Liver: The liver is dense and difficult to penetrate with heterogeneous echotexture. The liver appears echogenic relative to the right kidney on image 109. No discrete liver lesion. Portal vein is patent on color Doppler imaging with normal direction of blood flow towards the liver. Other findings: Negative visible right kidney.  No free fluid. IMPRESSION: 1. Evidence of hepatic steatosis, and coarse liver echotexture might indicate chronic hepatocellular disease. 2. Positive 12 mm gallstone, but no evidence of acute cholecystitis or bile duct obstruction. Electronically Signed   By: Genevie Ann M.D.   On: 10/10/2018 22:43    Pending Labs [employer] (From admission, onward)    Start     Ordered   Signed and Held  HIV antibody (Routine Testing)  Once,   R     Signed and Held          Vitals/Pain Today's Vitals   10/10/18 1822 10/10/18 2238 10/10/18 2245 10/11/18 0026  BP: (!) 162/107  (!) 159/121   Pulse: (!) 115  100   Resp: 18  16   Temp: 98.6 F (37 C) 100.1 F (37.8 C)    TempSrc:  Rectal    SpO2: 100%  100%   PainSc: 10-Worst pain ever  10-Worst pain ever    Isolation Precautions No active isolations  Medications Medications  sodium chloride 0.9 % bolus 1,000 mL (0 mLs Intravenous Stopped 10/11/18 0030)  morphine 4 MG/ML injection 4 mg (4 mg Intravenous Given 10/10/18 2237)  ondansetron (ZOFRAN) injection 4 mg (4 mg Intravenous Given 10/10/18 2235)  HYDROmorphone (DILAUDID) injection 1 mg (1 mg Intravenous Given 10/11/18 0028)     Mobility walks Low fall risk   Focused Assessments Cardiac Assessment Handoff:    No results found for: CKTOTAL, CKMB, CKMBINDEX, TROPONINI No results found for: DDIMER Does the Patient currently have chest pain? No      R Recommendations: See Admitting Provider Note  Report given to:   Additional Notes:

## 2018-10-11 NOTE — Transfer of Care (Signed)
Immediate Anesthesia Transfer of Care Note  Patient: CAMBRYN CHARTERS  Procedure(s) Performed: LAPAROSCOPIC CHOLECYSTECTOMY WITH INTRAOPERATIVE CHOLANGIOGRAM (N/A Abdomen)  Patient Location: PACU  Anesthesia Type:General  Level of Consciousness: awake, alert , oriented and patient cooperative  Airway & Oxygen Therapy: Patient Spontanous Breathing and Patient connected to face mask  Post-op Assessment: Report given to RN and Post -op Vital signs reviewed and stable  Post vital signs: Reviewed and stable  Last Vitals:  Vitals Value Taken Time  BP 146/97 10/11/18 1200  Temp    Pulse 88 10/11/18 1202  Resp 20 10/11/18 1202  SpO2 91 % 10/11/18 1202  Vitals shown include unvalidated device data.  Last Pain:  Vitals:   10/11/18 0933  TempSrc: Oral  PainSc: 0-No pain      Patients Stated Pain Goal: 4 (54/00/86 7619)  Complications: No apparent anesthesia complications

## 2018-10-11 NOTE — Discharge Summary (Addendum)
[facility] Discharge Summary   Patient ID: [name] Whitney Alleva MRN: [id number] DOB/AGE: Oct 17, 1985 33 y.o.  Admit date: 10/10/2018 Discharge date: 10/11/2018  Admitting Diagnosis: Symptomatic cholelithiasis/cholecystitis  Discharge Diagnosis Patient Active Problem List   Diagnosis Date Noted  . Cholelithiasis with cholecystitis 10/11/2018  . Panic disorder without agoraphobia 06/14/2011  . Adjustment disorder with anxiety 06/08/2011    Consultants None  Imaging: Dg Cholangiogram Operative  Result Date: 10/11/2018 CLINICAL DATA:  Intraoperative cholangiogram during laparoscopic cholecystectomy. EXAM: INTRAOPERATIVE CHOLANGIOGRAM FLUOROSCOPY TIME:  12 seconds COMPARISON:  Right upper quadrant abdominal ultrasound-10/10/2018 FINDINGS: Intraoperative cholangiographic images of the right upper abdominal quadrant during laparoscopic cholecystectomy are provided for review. Surgical clips overlie the expected location of the gallbladder fossa. Contrast injection demonstrates selective cannulation of the central aspect of the cystic duct. There is passage of contrast through the central aspect of the cystic duct with filling of a non dilated common bile duct. There is passage of contrast though the CBD and into the descending portion of the duodenum. There is minimal reflux of injected contrast into the common hepatic duct and central aspect of the non dilated intrahepatic biliary system. There are no discrete filling defects within the opacified portions of the biliary system to suggest the presence of choledocholithiasis. IMPRESSION: No evidence of choledocholithiasis. Electronically Signed   By: Simonne ComeJohn  Watts M.D.   On: 10/11/2018 11:50   [place] Abdomen Limited Ruq  Result Date: 10/10/2018 CLINICAL DATA:  33 year old female with right upper quadrant pain for 1 month. EXAM: ULTRASOUND ABDOMEN LIMITED RIGHT UPPER QUADRANT COMPARISON:  CT Abdomen and Pelvis 12/03/2016. FINDINGS: Gallbladder:  No gallbladder wall thickening, but a 12 millimeter shadowing stone is identified on image 18. No pericholecystic fluid. No sonographic Murphy sign elicited. Common bile duct: Diameter: 5 millimeters, normal. Liver: The liver is dense and difficult to penetrate with heterogeneous echotexture. The liver appears echogenic relative to the right kidney on image 109. No discrete liver lesion. Portal vein is patent on color Doppler imaging with normal direction of blood flow towards the liver. Other findings: Negative visible right kidney.  No free fluid. IMPRESSION: 1. Evidence of hepatic steatosis, and coarse liver echotexture might indicate chronic hepatocellular disease. 2. Positive 12 mm gallstone, but no evidence of acute cholecystitis or bile duct obstruction. Electronically Signed   By: Odessa FlemingH  Hall M.D.   On: 10/10/2018 22:43    Procedures Dr. Ezzard StandingNewman (10/11/18) - [facility]  H[facility] Course:  [name] Whitney Smolen is a 33yo female who [facility] with worsening RUQ pain, nausea, and vomiting. She has known gallstones and was scheduled for elective cholecystectomy 10/20/2018.  Workup showed low-grade temperature of 100.1.  Blood pressure 159/121.  Heart rate was 100.  CMP shows a glucose of 144, creatinine of 103, AST of 42, ALT of 60, total bilirubin 0.4.  Lipase 22.  WBC 12.4, hemoglobin 12.6, hematocrit 40.6, platelets 349,000.  Her COVID is negative. Abdominal ultrasound shows no gallbladder wall thickening but there is a 12 mm shadowing stone present.  No pericholecystic fluid common bile duct was 5 mm.  There is also evidence of hepatic steatosis.  No evidence of acute cholecystitis or bile duct obstruction. Patient was admitted and underwent procedure listed above.  Tolerated procedure well and was transferred to the floor.  Diet was advanced as tolerated.  On POD0, the patient was voiding well, tolerating diet, ambulating well, pain well  controlled, vital signs stable, incisions c/d/i  and felt stable for discharge home.  Patient will follow up as below and knows to call with questions or concerns.    I have personally reviewed the patients medication history on the [place] controlled substance database.    Physical Exam: General:  Alert, NAD, pleasant, comfortable Pulm: effort normal Abd:  obese, soft, ND, appropriately tender, multiple lap incisions C/D/I   Allergies as of 10/11/2018      Reactions   Cleocin [clindamycin Hcl]    Pallor and sweating   Compazine Other (See Comments)   Muscle spasms   Promethazine Hcl Other (See Comments)   Muscle spasms   Risperidone And Related Hives, Swelling   Throat swelling      Medication List    STOP taking these medications   azithromycin 250 MG tablet Commonly known as: ZITHROMAX   citalopram 20 MG tablet Commonly known as: CELEXA   doxycycline 100 MG capsule Commonly known as: VIBRAMYCIN   HYDROcodone-acetaminophen 5-325 MG tablet Commonly known as: NORCO/VICODIN   metroNIDAZOLE 500 MG tablet Commonly known as: Flagyl   ondansetron 4 MG disintegrating tablet Commonly known as: Zofran ODT     TAKE these medications   ALPRAZolam 0.5 MG tablet Commonly known as: Xanax Take 1 tablet (0.5 mg total) by mouth 2 (two) times daily as needed for anxiety.   amLODipine 10 MG tablet Commonly known as: NORVASC Take 10 mg by mouth daily.   cloNIDine 0.1 MG tablet Commonly known as: CATAPRES Take 1 tablet (0.1 mg total) by mouth 2 (two) times daily.   dicyclomine 20 MG tablet Commonly known as: BENTYL Take 1 tablet (20 mg total) by mouth daily for 14 days.   drospirenone-ethinyl estradiol 3-0.02 MG tablet Commonly known as: YAZ Take 1 tablet by mouth daily.   omeprazole 40 MG capsule Commonly known as: PRILOSEC Take 40 mg by mouth 2 (two) times a day.   oxyCODONE 5 MG immediate release tablet Commonly known as: Oxy IR/ROXICODONE Take 1 tablet (5 mg total) by  mouth every 6 (six) hours as needed for severe pain.        Follow-up Information    [facility] up on 10/26/2018.   Specialty: General Surgery Why: Follow up appointment scheduled for 2:45 AM. A provider will call you during scheduled appointment time. Please send a photo of incisions,license, and insurance card to [email or web address] the day prior to appointment.   Contact information: [address] [place] [address] [phone]        Shawnee Knapp, MD Follow up.   Specialty: Family Medicine Why: call and let them know you had surgery.  Let them assist with your home medicines. Contact information: [place] [address] [phone]           Signed: Wellington Hampshire, [facility] 10/11/2018, 3:59 PM Pager: [phone]  Agree with above.  Alphonsa Overall, MD, [facility] Surgery Pager: [phone] Office phone:  [phone]

## 2018-10-11 NOTE — Progress Notes (Signed)
Writer gave discharge instructions to patient. Patient had no questions. Writer walked patient outside infront of the building where her family was to pick her up

## 2018-10-11 NOTE — Op Note (Signed)
10/10/2018 - 10/11/2018  11:42 AM  PATIENT:  [name] Whitney, 33 y.o., female, MRN: [id number]  PREOP DIAGNOSIS:  cholecystitis  POSTOP DIAGNOSIS:   Mild cholecystitis, stone impacted in the cystic duct  PROCEDURE:   Procedure(s):  LAPAROSCOPIC CHOLECYSTECTOMY WITH INTRAOPERATIVE CHOLANGIOGRAM [5 port]  SURGEON:   Alphonsa Overall, M.D.  ASSISTANT:   Enid Cutter, PA  ANESTHESIA:   general  Anesthesiologist: Freddrick March, MD CRNA: Claudia Desanctis, CRNA  General  ASA: 2E  EBL:  minimal  ml  BLOOD ADMINISTERED: none  DRAINS: none   LOCAL MEDICATIONS USED:   30 cc of 1/4% marcaine  SPECIMEN:   Gall bladder  COUNTS CORRECT:  YES  INDICATIONS FOR PROCEDURE:  [name] Whitney is a 33 y.o. (DOB: 03/12/1986) white female whose primary care physician is Shawnee Knapp, MD and comes for cholecystectomy.   The indications and risks of the gall bladder surgery were explained to the patient.  The risks include, but are not limited to, infection, bleeding, common bile duct injury and open surgery.  SURGERY:  The patient was taken to OR room #1 at [facility].  The abdomen was prepped with chloroprep.  The patient was on Rocephin prior to the beginning of the operation.   A time out was held and the surgical checklist run.   I accessed the abdominal cavity with a 5 mm Ethicon Optiview trocar in the left upper quadrant.  Four additional trocars were inserted: a 10 mm trocar in the sub-xiphoid location, a 5 mm trocar in the right mid subcostal area, a 5 mm trocar in the right lateral subcostal area, and a 5 mm trocar just to the right of the umbilicus for the camera.   The abdomen was explored and the stomach and bowel that could be seen were unremarkable.  The liver was yellow and fatty, consistent with hepatic steatosis.  I discussed this with her father.   The gall bladder was fairly unremarkable, with some minimal disease.   I grasped the gall bladder and rotated it  cephalad.  The gall bladder was partially intrahepatic, but the liver also flopped on both sides of the gall bladder making it difficult to expose.  But I had two hands and my assistant had two hands to expose the gall bladder.  The gall bladder and cystic duct dove high in the hepatic hilum.  But I was able to see the gall bladder/cystic duct junction and the cystic duct isolated.  A clip was placed on the gall bladder side of the cystic duct.   When I made an incision in the cystic duct, I had to remove an impacted cystic duct stone before I could make shoot the cholangiogram.  An intra-operative cholangiogram was shot.   The intra-operative cholangiogram was shot using a cut off Taut catheter placed through a 14 gauge angiocath in the RUQ.  The Taut catheter was inserted in the cut cystic duct and secured with an endoclip.  A cholangiogram was shot with 8 cc of 1/2 strength Isoview.  Using fluoroscopy, the cholangiogram showed the flow of contrast into the common bile duct, up the hepatic radicals, and into the duodenum.  There was no mass or obstruction.  This was a normal intra-operative cholangiogram.   The Taut catheter was removed.  The cystic duct was tripley endoclipped and the cystic artery was identified and clipped.  The gall bladder was bluntly and sharpley dissected from the gall bladder bed.   After  the gall bladder was removed from the liver, the gall bladder bed, and Triangle of [place] were inspected.  There was no bleeding or bile leak.  The gall bladder was placed in a Ecco Sac bag and delivered through the subxiphoid incision..  The abdomen was irrigated with 1,000 cc saline.  I did have a tear in the liver capsule retracting that I cauterized and placed surgicel over.   The trocars were then removed.  I infiltrated 30 cc of 1/4% Marcaine into the incisions.  The umbilical port closed with a 0 Vicryl suture and the skin closed with 4-0 Monocryl.  The skin was painted with DermaBond.  The  patient's sponge and needle count were correct.  The patient was transported to the RR in good condition.  Ovidio Kinavid Smiley Birr, MD, [facility] Pager: [phone] Office phone:  [phone]

## 2018-10-12 ENCOUNTER — Encounter (HOSPITAL_COMMUNITY): Payer: Self-pay | Admitting: Surgery

## 2018-10-13 ENCOUNTER — Inpatient Hospital Stay (HOSPITAL_COMMUNITY)
Admission: RE | Admit: 2018-10-13 | Discharge: 2018-10-13 | Disposition: A | Discharge status: Home / Self Care | Payer: Medicaid Other | Source: Ambulatory Visit

## 2018-10-13 NOTE — Anesthesia Postprocedure Evaluation (Signed)
Anesthesia Post Note  Patient: [name] Whitney  Procedure(s) Performed: LAPAROSCOPIC CHOLECYSTECTOMY WITH INTRAOPERATIVE CHOLANGIOGRAM (N/A Abdomen)     Patient location during evaluation: PACU Anesthesia Type: General Level of consciousness: awake and alert Pain management: pain level controlled Vital Signs Assessment: post-procedure vital signs reviewed and stable Respiratory status: spontaneous breathing, nonlabored ventilation, respiratory function stable and patient connected to nasal cannula oxygen Cardiovascular status: blood pressure returned to baseline and stable Postop Assessment: no apparent nausea or vomiting Anesthetic complications: no    Last Vitals:  Vitals:   10/11/18 1425 10/11/18 1540  BP: 137/88 138/81  Pulse: 82 76  Resp: 16 16  Temp: 36.7 C 36.8 C  SpO2: 99% 99%    Last Pain:  Vitals:   10/11/18 1540  TempSrc: Oral  PainSc:                  Billye Pickerel L Vonne Mcdanel

## 2018-10-17 ENCOUNTER — Other Ambulatory Visit (HOSPITAL_COMMUNITY): Payer: Medicaid Other

## 2018-10-17 ENCOUNTER — Inpatient Hospital Stay (HOSPITAL_COMMUNITY): Admission: RE | Admit: 2018-10-17 | Payer: Medicaid Other | Source: Ambulatory Visit

## 2018-10-20 ENCOUNTER — Ambulatory Visit: Admit: 2018-10-20 | Payer: Medicaid Other | Admitting: General Surgery

## 2018-10-20 SURGERY — LAPAROSCOPIC CHOLECYSTECTOMY WITH INTRAOPERATIVE CHOLANGIOGRAM
Anesthesia: General

## 2019-01-23 IMAGING — DX DG CHEST 2V
2 series · 2 of 2 positions shown · non-contrast
Comparison: Prior radiograph from 03/11/2016.

CLINICAL DATA: Initial evaluation for acute cough for 1 month.

EXAM:
CHEST  2 VIEW

[chest pa]
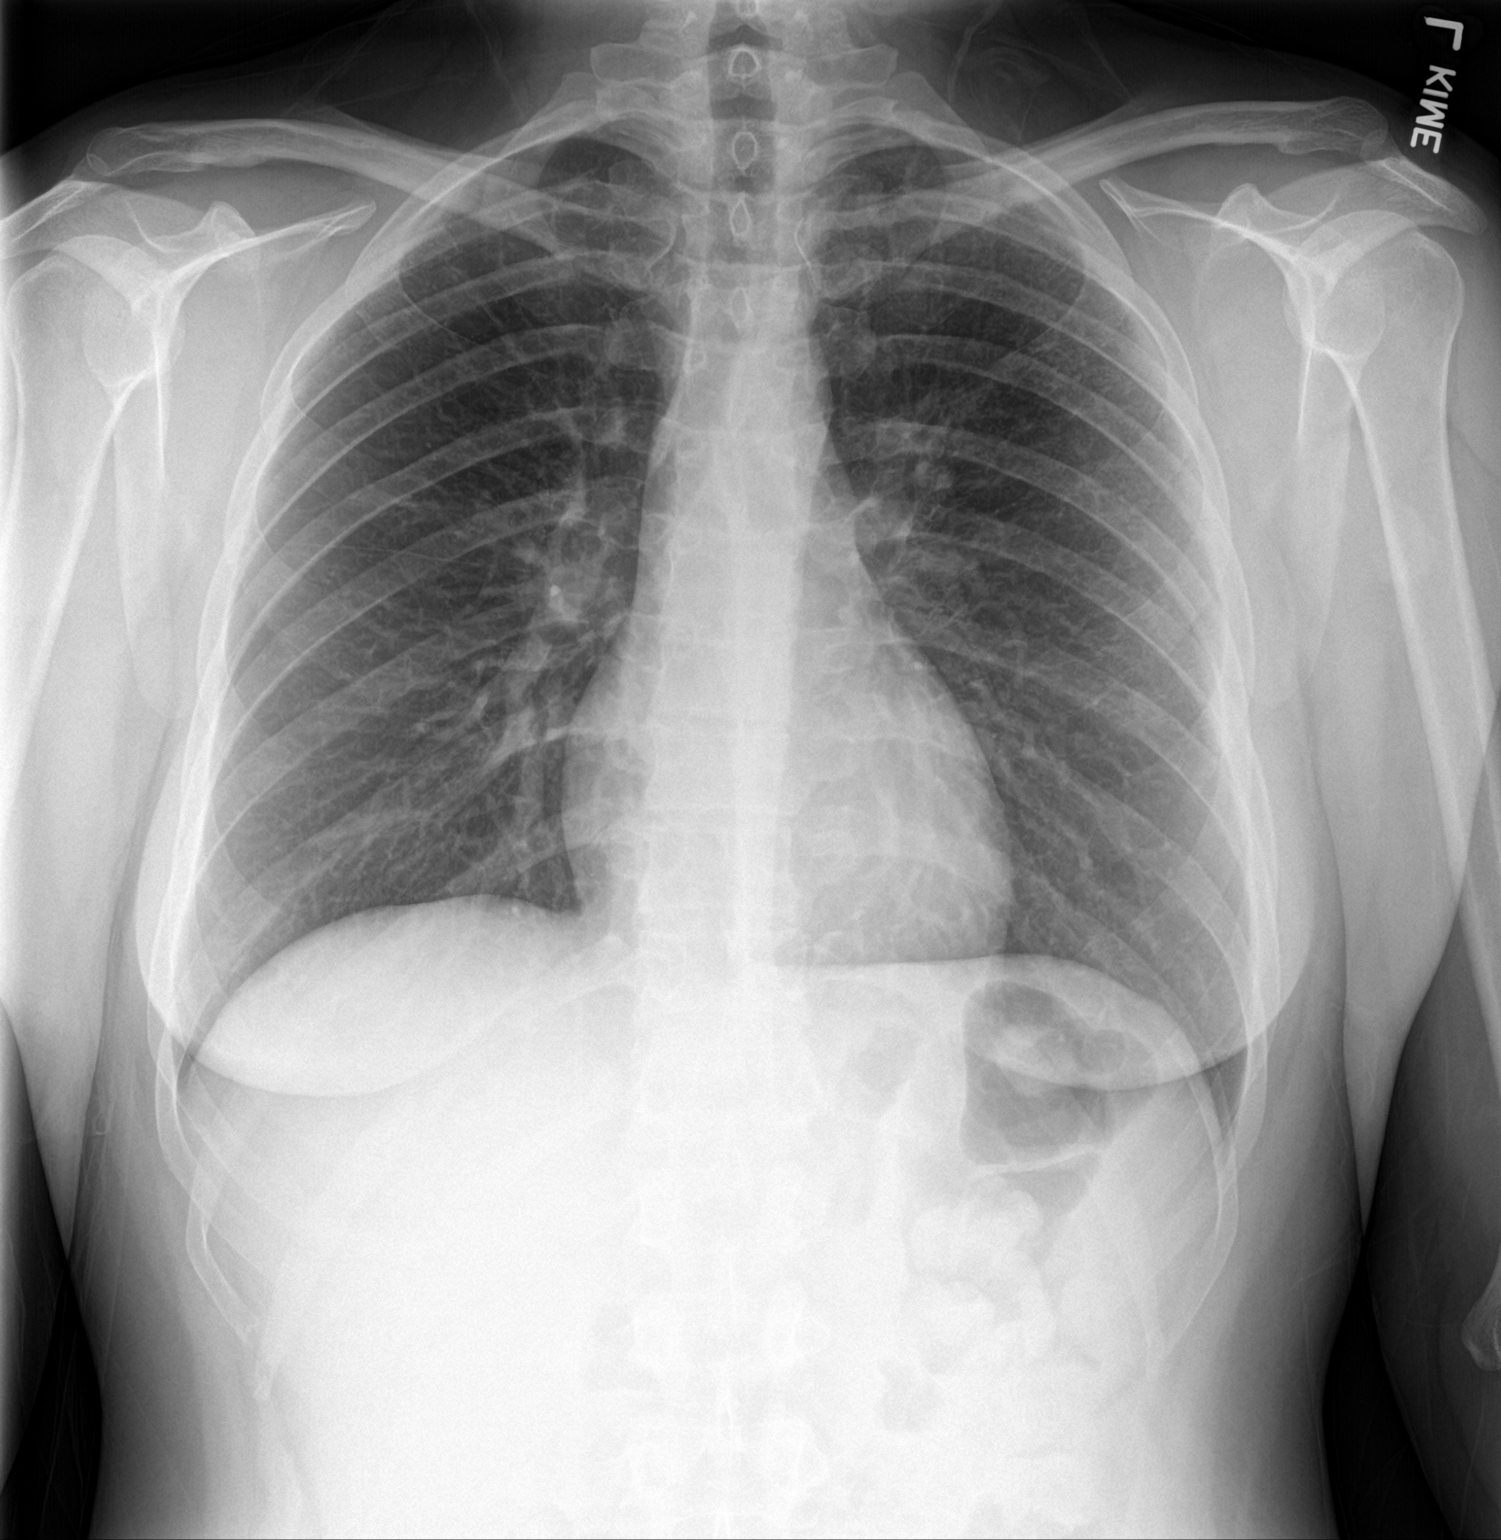

[chest lat]
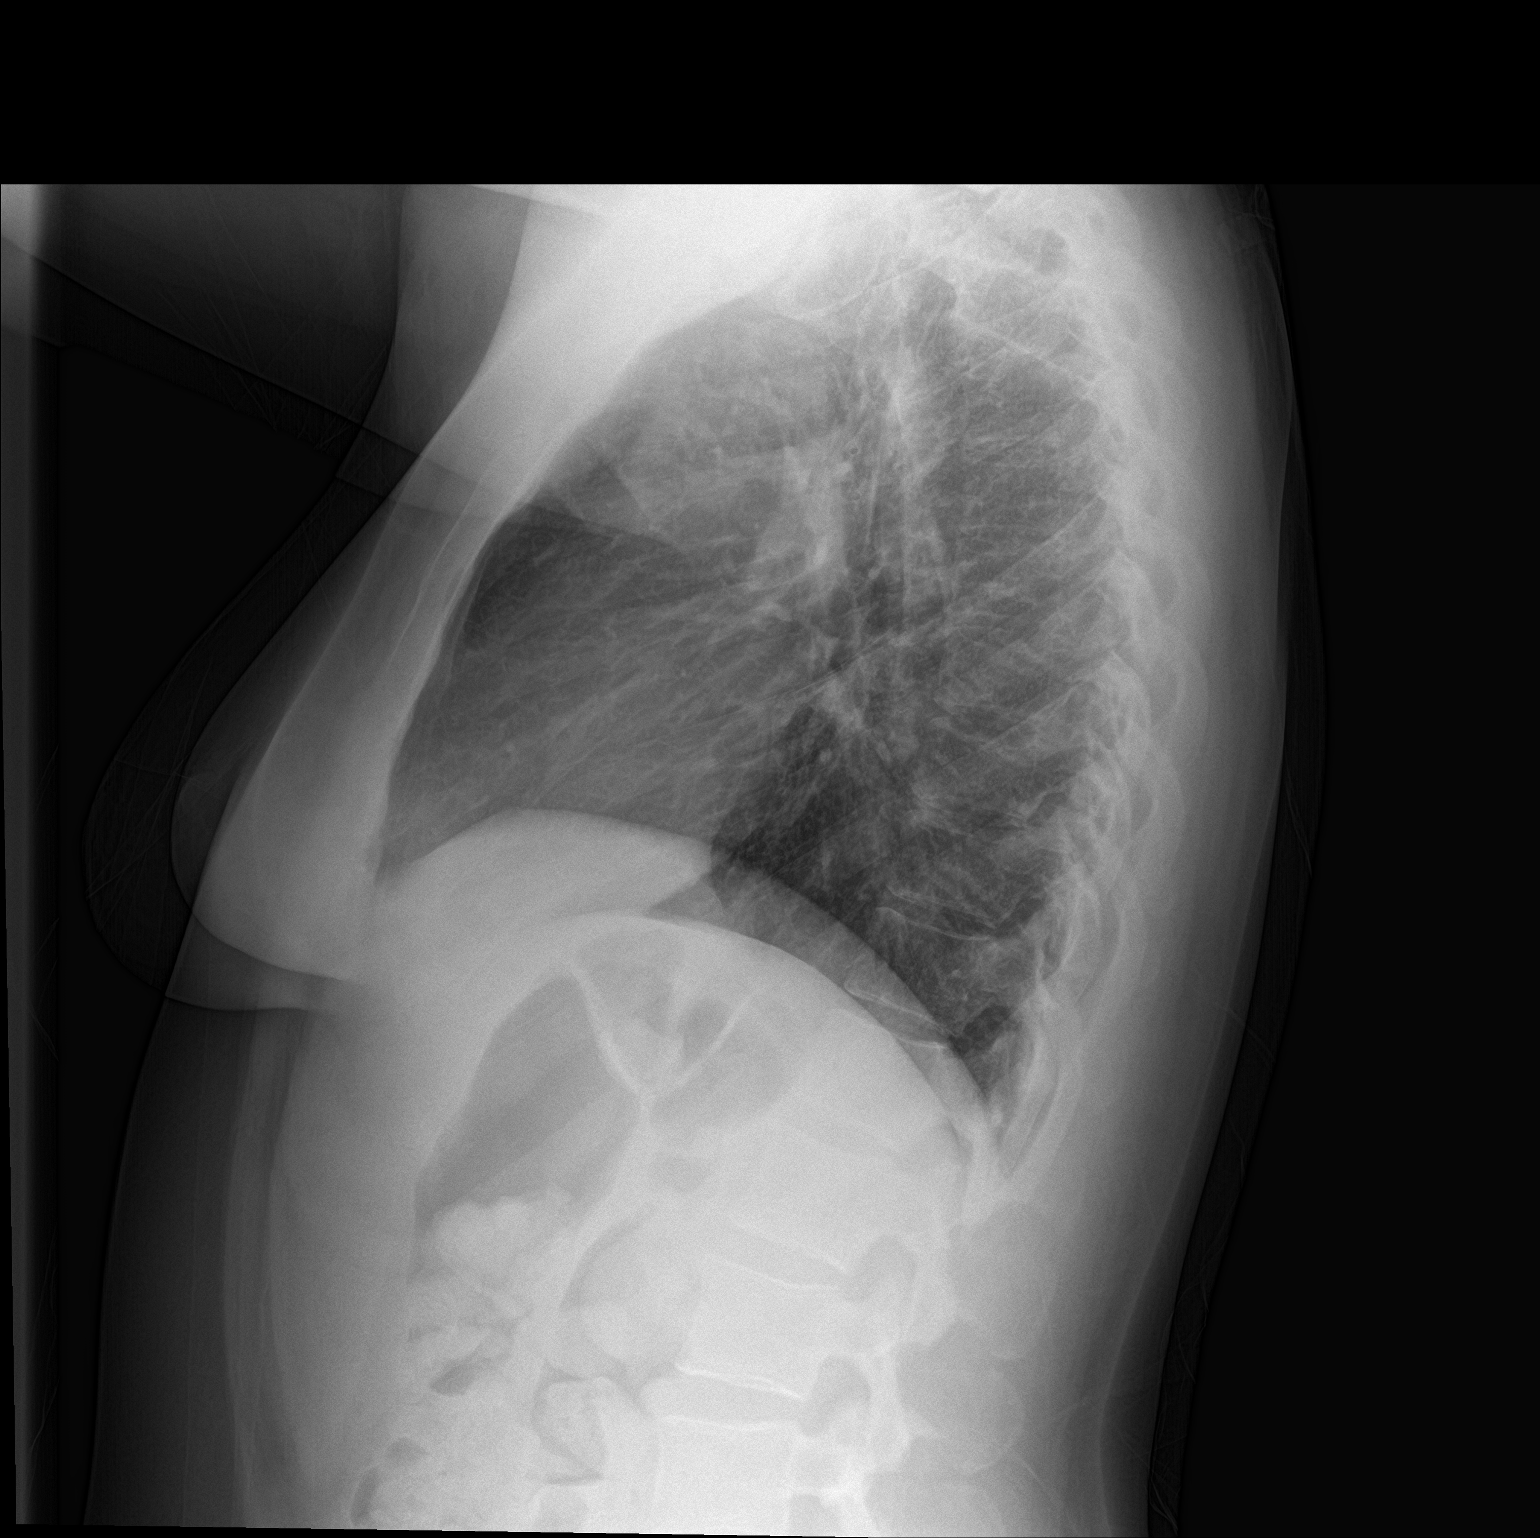

[2 of 2 positions shown; findings below may reference images not displayed]

FINDINGS: The cardiac and mediastinal silhouettes are stable in size and
contour, and remain within normal limits.

The lungs are normally inflated. No airspace consolidation, pleural
effusion, or pulmonary edema is identified. There is no
pneumothorax.

No acute osseous abnormality identified.
IMPRESSION: No active cardiopulmonary disease.

## 2020-12-06 IMAGING — US ULTRASOUND ABDOMEN LIMITED
1 series · 14 of 25 positions shown · non-contrast
Comparison: Ultrasound 12/24/2017

CLINICAL DATA: Right upper quadrant pain

EXAM:
ULTRASOUND ABDOMEN LIMITED RIGHT UPPER QUADRANT

[Series 1: ultrasound abdomen limited · 14 of 52 slices shown]
[im 1/52]
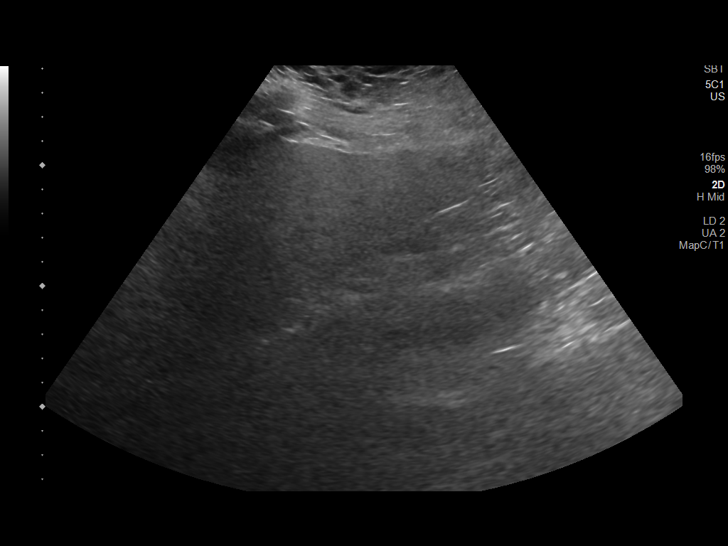
[im 5/52]
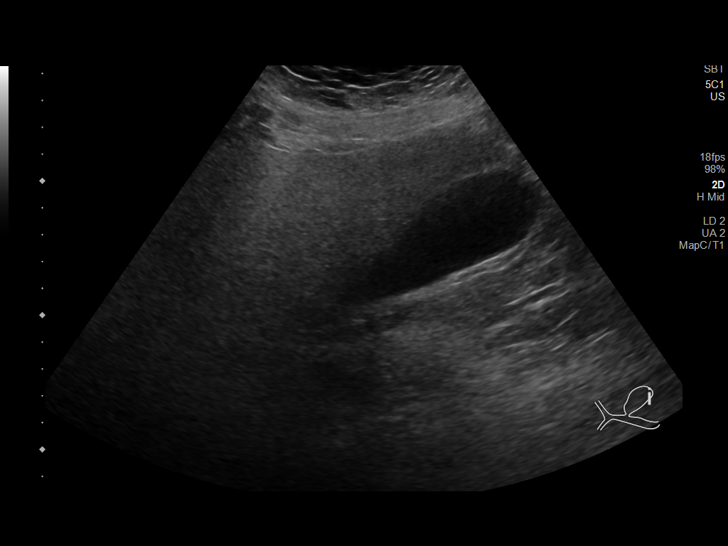
[im 9/52]
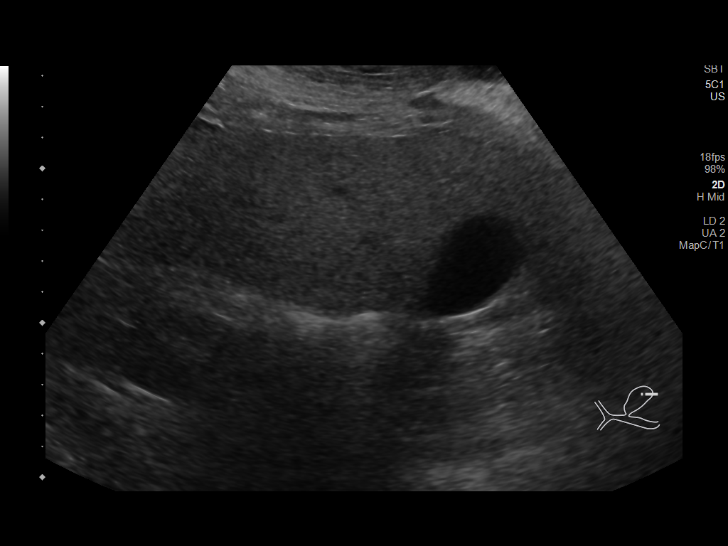
[im 13/52]
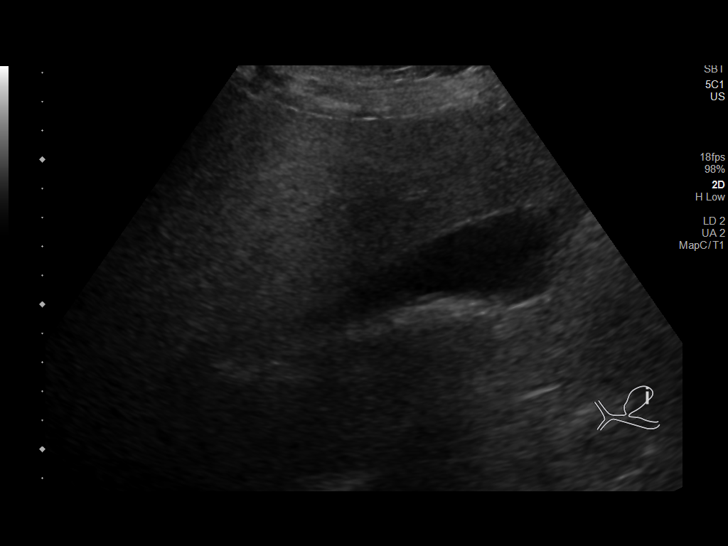
[im 18/52]
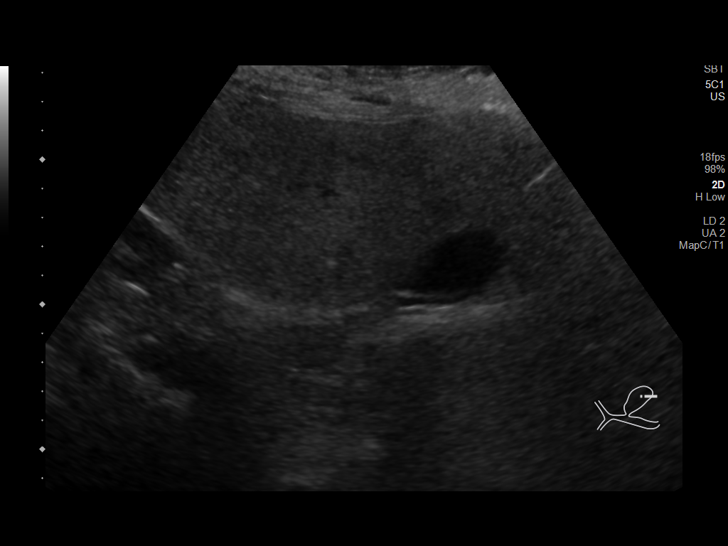
[im 20/52]
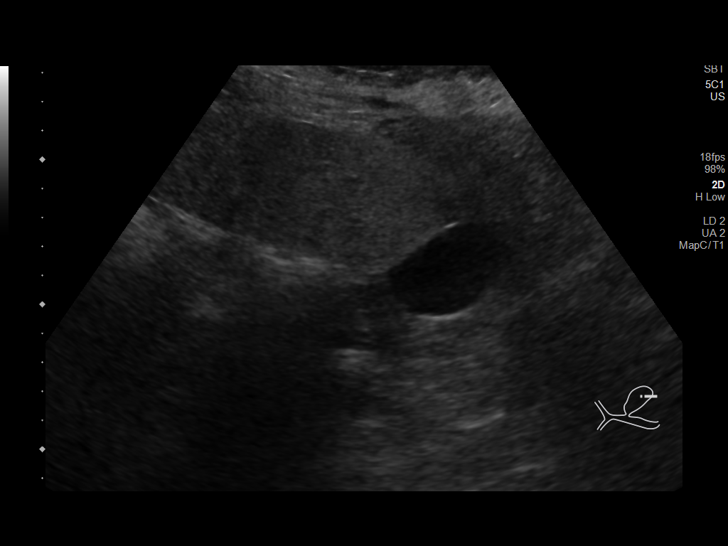
[im 24/52]
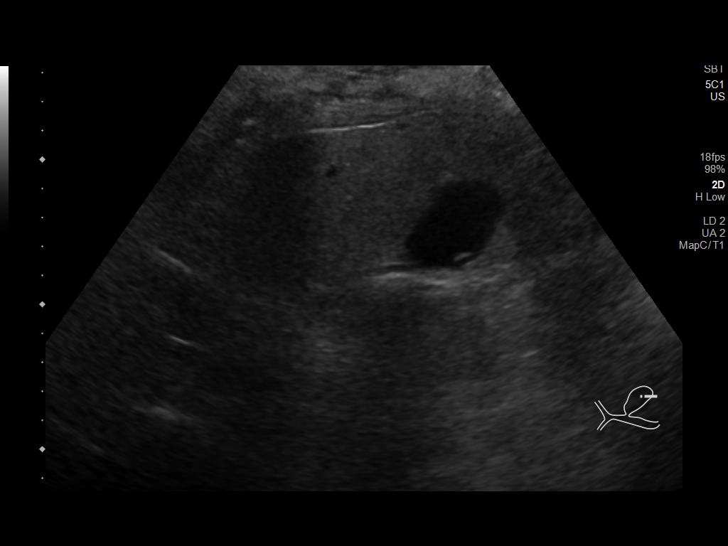
[im 28/52]
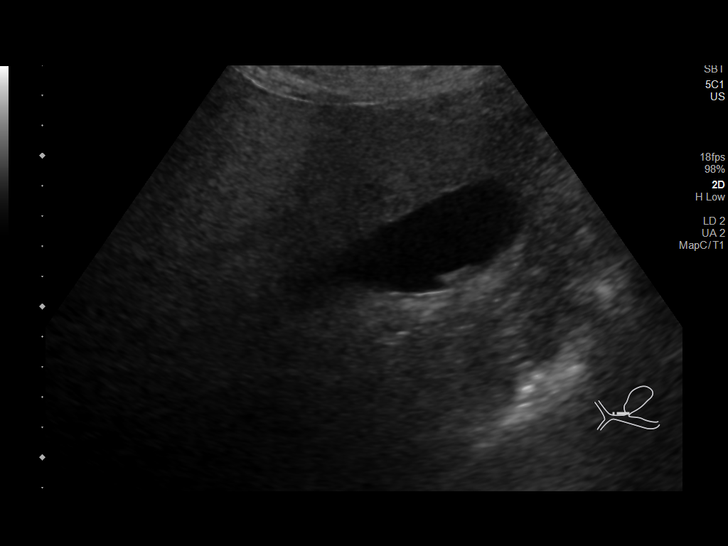
[im 32/52]
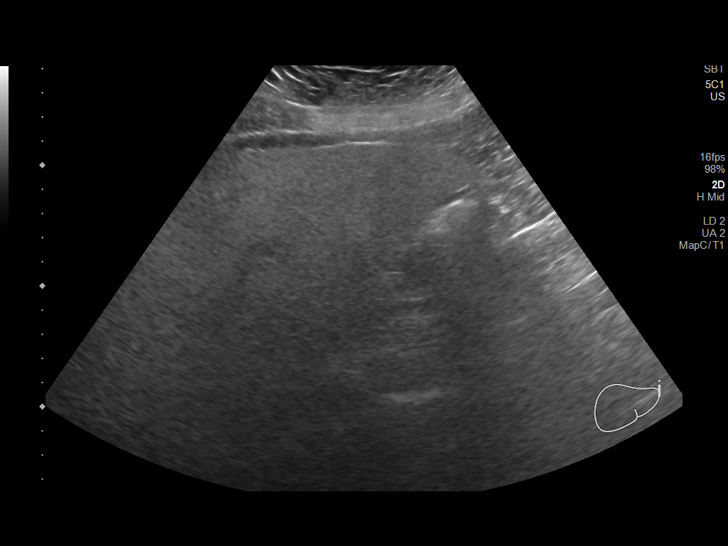
[im 35/52]
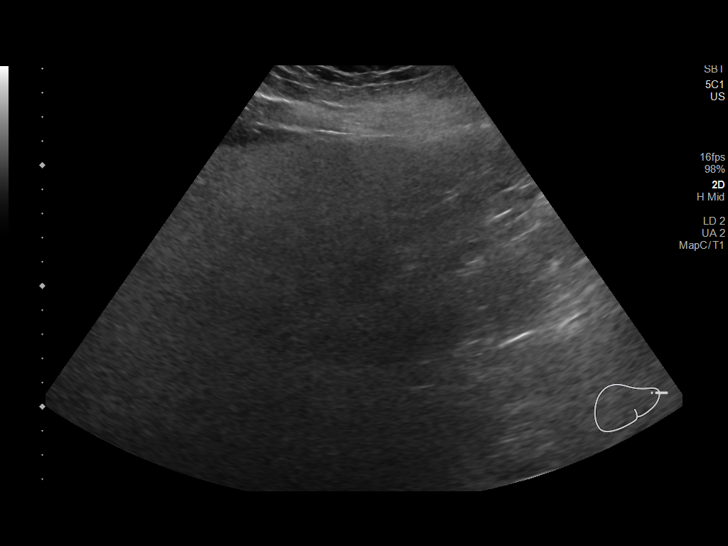
[im 39/52]
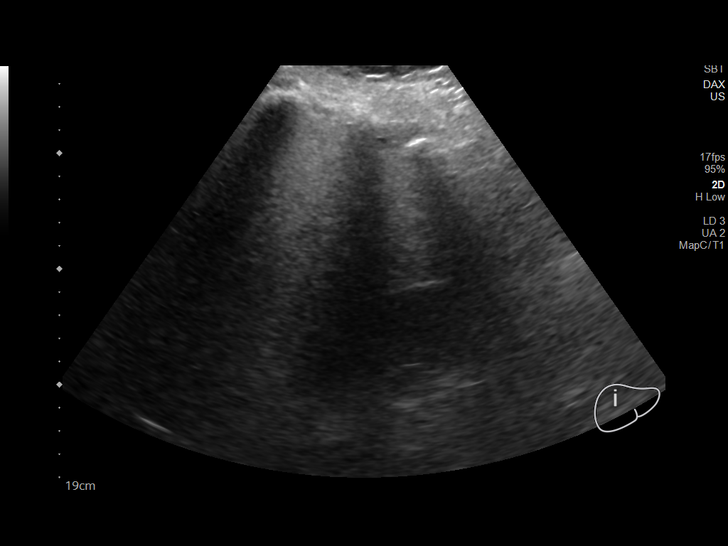
[im 43/52]
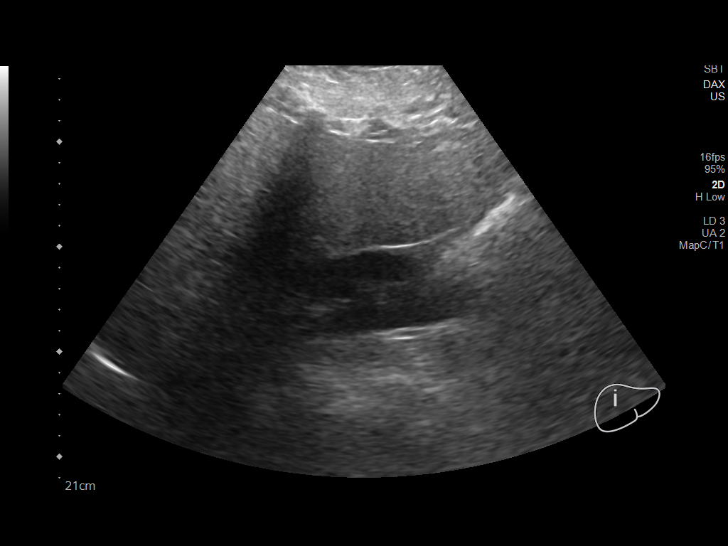
[im 47/52]
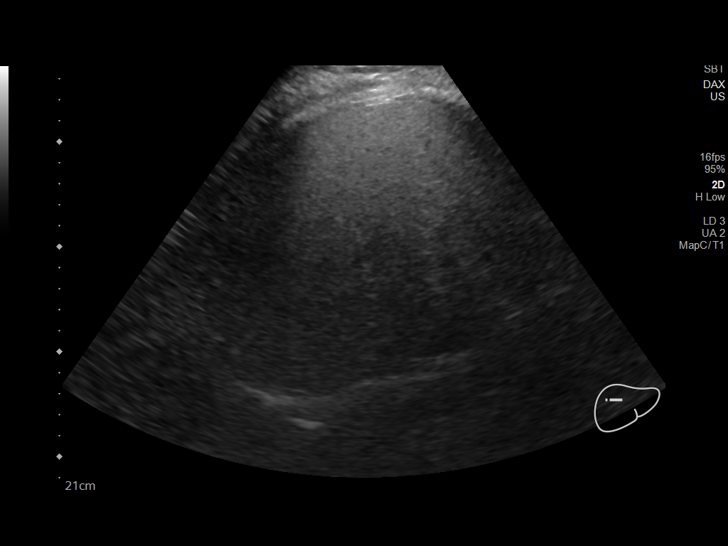
[im 52/52]
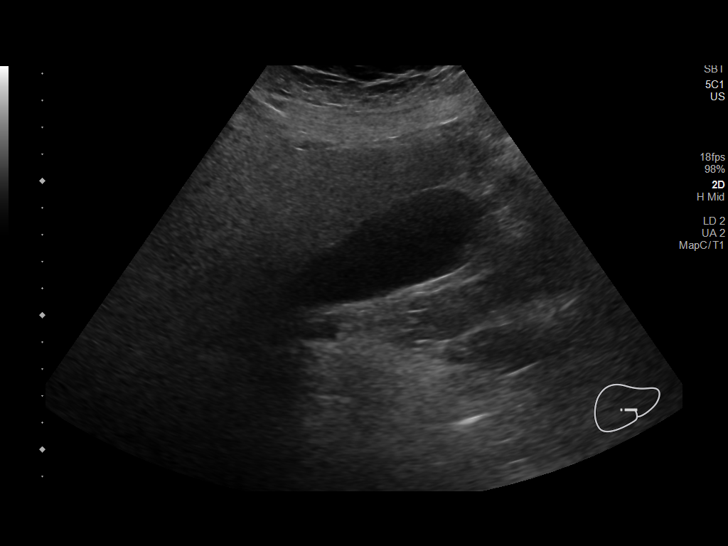

[14 of 25 positions shown; findings below may reference images not displayed]

FINDINGS: Gallbladder:

Small stones measuring up to 5 mm. Normal wall thickness. Negative
sonographic Murphy

Common bile duct:

Diameter: 3 mm

Liver:

Increased hepatic echogenicity. This limits evaluation of the
parenchyma. No obvious focal abnormality is seen. Portal vein is
patent on color Doppler imaging with normal direction of blood flow
towards the liver.
IMPRESSION: 1. Cholelithiasis without sonographic evidence for acute
cholecystitis
2. Echogenic liver consistent with steatosis

## 2023-04-30 ENCOUNTER — Emergency Department (HOSPITAL_BASED_OUTPATIENT_CLINIC_OR_DEPARTMENT_OTHER): Payer: Medicaid Other

## 2023-04-30 ENCOUNTER — Emergency Department (HOSPITAL_BASED_OUTPATIENT_CLINIC_OR_DEPARTMENT_OTHER)
Admission: EM | Admit: 2023-04-30 | Discharge: 2023-04-30 | Disposition: A | Discharge status: Home / Self Care | Payer: Medicaid Other

## 2023-04-30 ENCOUNTER — Encounter (HOSPITAL_BASED_OUTPATIENT_CLINIC_OR_DEPARTMENT_OTHER): Payer: Self-pay

## 2023-04-30 ENCOUNTER — Other Ambulatory Visit: Payer: Self-pay

## 2023-04-30 DIAGNOSIS — N12 Tubulo-interstitial nephritis, not specified as acute or chronic: Secondary | ICD-10-CM | POA: Insufficient documentation

## 2023-04-30 DIAGNOSIS — R109 Unspecified abdominal pain: Secondary | ICD-10-CM | POA: Diagnosis present

## 2023-04-30 DIAGNOSIS — D72829 Elevated white blood cell count, unspecified: Secondary | ICD-10-CM | POA: Diagnosis not present

## 2023-04-30 LAB — PREGNANCY, URINE: Preg Test, Ur: NEGATIVE

## 2023-04-30 LAB — COMPREHENSIVE METABOLIC PANEL
ALT: 11 U/L (ref 0–44)
AST: 10 U/L — ABNORMAL LOW (ref 15–41)
Albumin: 4.1 g/dL (ref 3.5–5.0)
Alkaline Phosphatase: 50 U/L (ref 38–126)
Anion gap: 10 (ref 5–15)
BUN: 11 mg/dL (ref 6–20)
CO2: 25 mmol/L (ref 22–32)
Calcium: 8.9 mg/dL (ref 8.9–10.3)
Chloride: 101 mmol/L (ref 98–111)
Creatinine, Ser: 0.9 mg/dL (ref 0.44–1.00)
GFR, Estimated: >60 mL/min (ref >60)
Glucose, Bld: 90 mg/dL (ref 70–99)
Potassium: 3.2 mmol/L — ABNORMAL LOW (ref 3.5–5.1)
Sodium: 136 mmol/L (ref 135–145)
Total Bilirubin: 1 mg/dL (ref 0.0–1.2)
Total Protein: 8 g/dL (ref 6.5–8.1)

## 2023-04-30 LAB — CBC WITH DIFFERENTIAL/PLATELET
Abs Immature Granulocytes: 0.06 10*3/uL (ref 0.00–0.07)
Basophils Absolute: 0.1 10*3/uL (ref 0.0–0.1)
Basophils Relative: 0 %
Eosinophils Absolute: 0 10*3/uL (ref 0.0–0.5)
Eosinophils Relative: 0 %
HCT: 39.8 % (ref 36.0–46.0)
Hemoglobin: 13.3 g/dL (ref 12.0–15.0)
Immature Granulocytes: 0 %
Lymphocytes Relative: 14 %
Lymphs Abs: 2.2 10*3/uL (ref 0.7–4.0)
MCH: 31.2 pg (ref 26.0–34.0)
MCHC: 33.4 g/dL (ref 30.0–36.0)
MCV: 93.4 fL (ref 80.0–100.0)
Monocytes Absolute: 1.3 10*3/uL — ABNORMAL HIGH (ref 0.1–1.0)
Monocytes Relative: 8 %
Neutro Abs: 12.4 10*3/uL — ABNORMAL HIGH (ref 1.7–7.7)
Neutrophils Relative %: 78 %
Platelets: 223 10*3/uL (ref 150–400)
RBC: 4.26 MIL/uL (ref 3.87–5.11)
RDW: 12.2 % (ref 11.5–15.5)
WBC: 16 10*3/uL — ABNORMAL HIGH (ref 4.0–10.5)
nRBC: 0 % (ref 0.0–0.2)

## 2023-04-30 LAB — URINALYSIS, W/ REFLEX TO CULTURE (INFECTION SUSPECTED)
Bilirubin Urine: NEGATIVE
Glucose, UA: NEGATIVE mg/dL
Hgb urine dipstick: NEGATIVE
Ketones, ur: NEGATIVE mg/dL
Nitrite: NEGATIVE
Protein, ur: NEGATIVE mg/dL
Specific Gravity, Urine: 1.01 (ref 1.005–1.030)
pH: 6.5 (ref 5.0–8.0)

## 2023-04-30 LAB — LIPASE, BLOOD: Lipase: 18 U/L (ref 11–51)

## 2023-04-30 MED ORDER — MORPHINE SULFATE (PF) 4 MG/ML IV SOLN
4.0000 mg | Freq: Once | INTRAVENOUS | Status: AC
  Administered 2023-04-30: 4 mg via INTRAVENOUS
  Filled 2023-04-30: qty 1

## 2023-04-30 MED ORDER — ONDANSETRON HCL 4 MG/2ML IJ SOLN
4.0000 mg | Freq: Once | INTRAMUSCULAR | Status: AC
  Administered 2023-04-30: 4 mg via INTRAVENOUS
  Filled 2023-04-30: qty 2

## 2023-04-30 MED ORDER — CEFPODOXIME PROXETIL 200 MG PO TABS: 200.0000 mg | ORAL_TABLET | Freq: Two times a day (BID) | ORAL | 0 refills | Status: AC

## 2023-04-30 MED ORDER — IOHEXOL 300 MG/ML  SOLN
75.0000 mL | Freq: Once | INTRAMUSCULAR | Status: AC | PRN
  Administered 2023-04-30: 75 mL via INTRAVENOUS

## 2023-04-30 MED ORDER — SODIUM CHLORIDE 0.9 % IV BOLUS
1000.0000 mL | Freq: Once | INTRAVENOUS | Status: AC
  Administered 2023-04-30: 1000 mL via INTRAVENOUS

## 2023-04-30 MED ORDER — KETOROLAC TROMETHAMINE 15 MG/ML IJ SOLN
15.0000 mg | Freq: Once | INTRAMUSCULAR | Status: AC
  Administered 2023-04-30: 15 mg via INTRAVENOUS
  Filled 2023-04-30: qty 1

## 2023-04-30 MED ORDER — SODIUM CHLORIDE 0.9 % IV SOLN
1.0000 g | Freq: Once | INTRAVENOUS | Status: AC
  Administered 2023-04-30: 1 g via INTRAVENOUS
  Filled 2023-04-30: qty 10

## 2023-04-30 NOTE — Discharge Instructions (Signed)
Your symptoms are concerning for a bladder infection, or possible kidney infection.  I prescribed you 7 days of antibiotics please take it as instructed.  Return to the ER if you have any increased pain, nausea, vomiting, fever or chills

## 2023-04-30 NOTE — ED Triage Notes (Signed)
Pt reports Frequency and odor in urine X 1 week and was drinking cranberry juice. Last night bilateral flank pain stated. Also endorses nausea & loss of appetite.  Reports hx of kidney infection

## 2023-04-30 NOTE — ED Provider Notes (Signed)
[place] [facility] Provider Note   CSN: [id number] Arrival date & time: 04/30/23  1027     History  Chief Complaint  Patient presents with   Flank Pain    [name] Whitney is a 38 y.o. female, history of UTIs, who presents to the ED secondary to bilateral flank pain, has been going on for the last day, as well as increased urinary frequency, and odor that is been going on for the last week.  She states she has severe bilateral flank pain, that is sharp and stabbing, and constant.  She states she hardly can get comfortable, and is having chills, and feels warm.  She denies any vaginal discharge, nausea, vomiting, or chest pain.  States she has had UTIs before, and this feels much worse.  Home Medications Prior to Admission medications   Medication Sig Start Date End Date Taking? Authorizing Provider  cefpodoxime (VANTIN) 200 MG tablet Take 1 tablet (200 mg total) by mouth 2 (two) times daily for 7 days. 04/30/23 05/07/23 Yes Yandiel Bergum L, PA  ALPRAZolam (XANAX) 0.5 MG tablet Take 1 tablet (0.5 mg total) by mouth 2 (two) times daily as needed for anxiety. 07/23/16   Sherren Mocha, MD  amLODipine (NORVASC) 10 MG tablet Take 10 mg by mouth daily.    [provider]  cloNIDine (CATAPRES) 0.1 MG tablet Take 1 tablet (0.1 mg total) by mouth 2 (two) times daily. Patient not taking: Reported on 10/10/2018 07/23/16   Sherren Mocha, MD  dicyclomine (BENTYL) 20 MG tablet Take 1 tablet (20 mg total) by mouth daily for 14 days. 09/21/18 10/05/18  Claude Manges, PA-C  drospirenone-ethinyl estradiol (YAZ) 3-0.02 MG tablet Take 1 tablet by mouth daily. 08/10/18   [provider]  omeprazole (PRILOSEC) 40 MG capsule Take 40 mg by mouth 2 (two) times a day.  09/21/18   [provider]  oxyCODONE (OXY IR/ROXICODONE) 5 MG immediate release tablet Take 1 tablet (5 mg total) by mouth every 6 (six) hours as needed for severe pain. 10/11/18   Meuth, Raynesha Tiedt A, PA-C   lithium carbonate (LITHOBID) 300 MG CR tablet Take 300 mg by mouth daily.   06/07/11  [provider]      Allergies    Cleocin [clindamycin hcl], Compazine, Promethazine hcl, and Risperidone and related    Review of Systems   Review of Systems  Genitourinary:  Positive for flank pain.    Physical Exam Updated Vital Signs BP 108/76   Pulse 69   Temp 99.7 F (37.6 C) (Oral)   Resp 18   SpO2 100%  Physical Exam Vitals and nursing note reviewed.  Constitutional:      General: She is not in acute distress.    Appearance: She is well-developed.  HENT:     Head: Normocephalic and atraumatic.  Eyes:     Conjunctiva/sclera: Conjunctivae normal.  Cardiovascular:     Rate and Rhythm: Normal rate and regular rhythm.     Heart sounds: No murmur heard. Pulmonary:     Effort: Pulmonary effort is normal. No respiratory distress.     Breath sounds: Normal breath sounds.  Abdominal:     Palpations: Abdomen is soft.     Tenderness: There is abdominal tenderness in the right lower quadrant and left lower quadrant. There is right CVA tenderness and left CVA tenderness.  Musculoskeletal:        General: No swelling.     Cervical back:  Neck supple.  Skin:    General: Skin is warm and dry.     Capillary Refill: Capillary refill takes less than 2 seconds.  Neurological:     Mental Status: She is alert.  Psychiatric:        Mood and Affect: Mood normal.     ED Results / Procedures / Treatments   Labs (all labs ordered are listed, but only abnormal results are displayed) Labs Reviewed  URINALYSIS, W/ REFLEX TO CULTURE (INFECTION SUSPECTED) - Abnormal; Notable for the following components:      Result Value   Leukocytes,Ua TRACE (*)    Bacteria, UA FEW (*)    All other components within normal limits  COMPREHENSIVE METABOLIC PANEL - Abnormal; Notable for the following components:   Potassium 3.2 (*)    AST 10 (*)    All other components within normal limits  CBC WITH  DIFFERENTIAL/PLATELET - Abnormal; Notable for the following components:   WBC 16.0 (*)    Neutro Abs 12.4 (*)    Monocytes Absolute 1.3 (*)    All other components within normal limits  LIPASE, BLOOD  PREGNANCY, URINE    EKG None  Radiology CT ABDOMEN PELVIS W CONTRAST Result Date: 04/30/2023 CLINICAL DATA:  Acute bilateral flank pain. EXAM: CT ABDOMEN AND PELVIS WITH CONTRAST TECHNIQUE: Multidetector CT imaging of the abdomen and pelvis was performed using the standard protocol following bolus administration of intravenous contrast. RADIATION DOSE REDUCTION: This exam was performed according to the departmental dose-optimization program which includes automated exposure control, adjustment of the mA and/or kV according to patient size and/or use of iterative reconstruction technique. CONTRAST:  75mL OMNIPAQUE IOHEXOL 300 MG/ML  SOLN COMPARISON:  December 03, 2016. FINDINGS: Lower chest: No acute abnormality. Hepatobiliary: No focal liver abnormality is seen. Status post cholecystectomy. No biliary dilatation. Pancreas: Unremarkable. No pancreatic ductal dilatation or surrounding inflammatory changes. Spleen: Normal in size without focal abnormality. Adrenals/Urinary Tract: Adrenal glands are unremarkable. Kidneys are normal, without renal calculi, focal lesion, or hydronephrosis. Bladder is unremarkable. Stomach/Bowel: Stomach is unremarkable. There is no evidence of bowel obstruction or inflammation. The appendix is not visualized. Vascular/Lymphatic: No significant vascular findings are present. No enlarged abdominal or pelvic lymph nodes. Reproductive: Uterus and bilateral adnexa are unremarkable. Other: No abdominal wall hernia or abnormality. No abdominopelvic ascites. Musculoskeletal: No acute or significant osseous findings. IMPRESSION: No acute abnormality seen in the abdomen or pelvis. Electronically Signed   By: Lupita Raider M.D.   On: 04/30/2023 14:19    Procedures Procedures     Medications Ordered in ED Medications  cefTRIAXone (ROCEPHIN) 1 g in sodium chloride 0.9 % 100 mL IVPB (1 g Intravenous New Bag/Given 04/30/23 1450)  ketorolac (TORADOL) 15 MG/ML injection 15 mg (has no administration in time range)  ondansetron (ZOFRAN) injection 4 mg (4 mg Intravenous Given 04/30/23 1148)  sodium chloride 0.9 % bolus 1,000 mL (0 mLs Intravenous Stopped 04/30/23 1302)  morphine (PF) 4 MG/ML injection 4 mg (4 mg Intravenous Given 04/30/23 1148)  iohexol (OMNIPAQUE) 300 MG/ML solution 75 mL (75 mLs Intravenous Contrast Given 04/30/23 1345)    ED Course/ Medical Decision Making/ A&P                                 Medical Decision Making Patient is a 38 year old female, here for bilateral flank pain, urinary symptoms, that been going on for the past week.  Flank  pain started yesterday.  She is overall well-appearing, has bilateral CVA tenderness.  Reports urinary frequency, and smell.  We will obtain a CT abdomen pelvis, as she has a history of pyelonephritis, has bilateral flank pain, it has been over a week since her symptoms started, concern for possible abscess.  Will also obtain blood work, urinalysis for further evaluation  Amount and/or Complexity of Data Reviewed Labs: ordered.    Details: Urinalysis shows bacteria, leukocytes, and leukocytosis of 16K Radiology: ordered.    Details: CT abdomen pelvis, shows no acute findings Discussion of management or test interpretation with external provider(s): Discussed with patient, her CT abdomen pelvis is unremarkable, however her urinalysis is concerning for infection.  Given bilateral flank pain, concern for pyelonephritis.  We will give her 1 dose of ceftriaxone and cefpodoxime, from home.  We discussed follow-up with PCP, and return if symptoms are worsening  Risk Prescription drug management.   Final Clinical Impression(s) / ED Diagnoses Final diagnoses:  Pyelonephritis    Rx / DC Orders ED Discharge Orders           Ordered    cefpodoxime (VANTIN) 200 MG tablet  2 times daily        04/30/23 1447              Jess Sulak, [place], [place] 04/30/23 1450    Durwin Glaze, MD 04/30/23 [phone]
